# Patient Record
Sex: Female | Born: 1995 | Race: Black or African American | Hispanic: No | Marital: Single | Smoking: Never smoker
Health system: Southern US, Community
[De-identification: ages and names within clinical notes are randomized; demographics above are authoritative.]

## PROBLEM LIST (undated history)

## (undated) DIAGNOSIS — I10 Essential (primary) hypertension: Secondary | ICD-10-CM

## (undated) DIAGNOSIS — F329 Major depressive disorder, single episode, unspecified: Secondary | ICD-10-CM

## (undated) DIAGNOSIS — F32A Depression, unspecified: Secondary | ICD-10-CM

## (undated) DIAGNOSIS — F419 Anxiety disorder, unspecified: Secondary | ICD-10-CM

## (undated) HISTORY — PX: FRACTURE SURGERY: SHX138

---

## 2015-07-20 ENCOUNTER — Emergency Department (HOSPITAL_COMMUNITY)
Admission: EM | Admit: 2015-07-20 | Discharge: 2015-07-20 | Disposition: A | Discharge status: Home / Self Care | Payer: Self-pay

## 2016-05-03 ENCOUNTER — Encounter (HOSPITAL_COMMUNITY): Payer: Self-pay | Admitting: Emergency Medicine

## 2016-05-03 ENCOUNTER — Emergency Department (HOSPITAL_COMMUNITY)
Admission: EM | Admit: 2016-05-03 | Discharge: 2016-05-03 | Disposition: A | Discharge status: Home / Self Care | Payer: Self-pay | Attending: Emergency Medicine | Admitting: Emergency Medicine

## 2016-05-03 DIAGNOSIS — S0990XA Unspecified injury of head, initial encounter: Secondary | ICD-10-CM | POA: Insufficient documentation

## 2016-05-03 DIAGNOSIS — S3991XA Unspecified injury of abdomen, initial encounter: Secondary | ICD-10-CM | POA: Insufficient documentation

## 2016-05-03 DIAGNOSIS — Y9389 Activity, other specified: Secondary | ICD-10-CM | POA: Insufficient documentation

## 2016-05-03 DIAGNOSIS — S3992XA Unspecified injury of lower back, initial encounter: Secondary | ICD-10-CM | POA: Insufficient documentation

## 2016-05-03 DIAGNOSIS — Y92410 Unspecified street and highway as the place of occurrence of the external cause: Secondary | ICD-10-CM | POA: Insufficient documentation

## 2016-05-03 DIAGNOSIS — Y998 Other external cause status: Secondary | ICD-10-CM | POA: Insufficient documentation

## 2016-05-03 NOTE — ED Notes (Signed)
Pt called for triage multiple time no answer RN and PA aware

## 2016-05-03 NOTE — ED Notes (Signed)
Pt did not answer call to room from triage x 3. Left from waiting room without being seen.
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
## 2016-05-03 NOTE — ED Notes (Signed)
Called for room X2. No response

## 2016-05-03 NOTE — ED Notes (Signed)
Called x's 3 without response,.

## 2016-05-03 NOTE — ED Notes (Signed)
Pt called for triage no answer  °

## 2016-05-04 ENCOUNTER — Emergency Department (HOSPITAL_COMMUNITY): Payer: No Typology Code available for payment source

## 2016-05-04 ENCOUNTER — Emergency Department (HOSPITAL_COMMUNITY)
Admission: EM | Admit: 2016-05-04 | Discharge: 2016-05-04 | Disposition: A | Discharge status: Home / Self Care | Payer: No Typology Code available for payment source | Attending: Emergency Medicine | Admitting: Emergency Medicine

## 2016-05-04 ENCOUNTER — Encounter (HOSPITAL_COMMUNITY): Payer: Self-pay

## 2016-05-04 DIAGNOSIS — S3992XA Unspecified injury of lower back, initial encounter: Secondary | ICD-10-CM | POA: Insufficient documentation

## 2016-05-04 DIAGNOSIS — Y92410 Unspecified street and highway as the place of occurrence of the external cause: Secondary | ICD-10-CM | POA: Insufficient documentation

## 2016-05-04 DIAGNOSIS — S0990XA Unspecified injury of head, initial encounter: Secondary | ICD-10-CM | POA: Diagnosis not present

## 2016-05-04 DIAGNOSIS — M542 Cervicalgia: Secondary | ICD-10-CM

## 2016-05-04 DIAGNOSIS — R51 Headache: Secondary | ICD-10-CM

## 2016-05-04 DIAGNOSIS — Y998 Other external cause status: Secondary | ICD-10-CM | POA: Insufficient documentation

## 2016-05-04 DIAGNOSIS — S79922A Unspecified injury of left thigh, initial encounter: Secondary | ICD-10-CM | POA: Diagnosis not present

## 2016-05-04 DIAGNOSIS — Z791 Long term (current) use of non-steroidal anti-inflammatories (NSAID): Secondary | ICD-10-CM | POA: Insufficient documentation

## 2016-05-04 DIAGNOSIS — R519 Headache, unspecified: Secondary | ICD-10-CM

## 2016-05-04 DIAGNOSIS — S0081XA Abrasion of other part of head, initial encounter: Secondary | ICD-10-CM | POA: Diagnosis not present

## 2016-05-04 DIAGNOSIS — M545 Low back pain: Secondary | ICD-10-CM

## 2016-05-04 DIAGNOSIS — S199XXA Unspecified injury of neck, initial encounter: Secondary | ICD-10-CM | POA: Diagnosis not present

## 2016-05-04 DIAGNOSIS — Y9389 Activity, other specified: Secondary | ICD-10-CM | POA: Insufficient documentation

## 2016-05-04 DIAGNOSIS — Z3202 Encounter for pregnancy test, result negative: Secondary | ICD-10-CM | POA: Diagnosis not present

## 2016-05-04 LAB — POC URINE PREG, ED: Preg Test, Ur: NEGATIVE

## 2016-05-04 MED ORDER — MELOXICAM 15 MG PO TABS: 15.0000 mg | ORAL_TABLET | Freq: Every day | ORAL | Status: DC

## 2016-05-04 MED ORDER — OXYCODONE-ACETAMINOPHEN 5-325 MG PO TABS
1.0000 | ORAL_TABLET | Freq: Once | ORAL | Status: AC
  Administered 2016-05-04: 1 via ORAL
  Filled 2016-05-04: qty 1
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
## 2016-05-20 ENCOUNTER — Encounter (HOSPITAL_COMMUNITY): Payer: Self-pay | Admitting: Emergency Medicine

## 2016-05-20 ENCOUNTER — Emergency Department (HOSPITAL_COMMUNITY)
Admission: EM | Admit: 2016-05-20 | Discharge: 2016-05-23 | Disposition: A | Discharge status: Other Acute Inpatient Hospital | Payer: No Typology Code available for payment source | Attending: Emergency Medicine | Admitting: Emergency Medicine

## 2016-05-20 ENCOUNTER — Emergency Department (HOSPITAL_COMMUNITY): Payer: No Typology Code available for payment source

## 2016-05-20 DIAGNOSIS — F1310 Sedative, hypnotic or anxiolytic abuse, uncomplicated: Secondary | ICD-10-CM | POA: Diagnosis present

## 2016-05-20 DIAGNOSIS — F1210 Cannabis abuse, uncomplicated: Secondary | ICD-10-CM | POA: Insufficient documentation

## 2016-05-20 DIAGNOSIS — T50902A Poisoning by unspecified drugs, medicaments and biological substances, intentional self-harm, initial encounter: Secondary | ICD-10-CM

## 2016-05-20 DIAGNOSIS — Z791 Long term (current) use of non-steroidal anti-inflammatories (NSAID): Secondary | ICD-10-CM | POA: Insufficient documentation

## 2016-05-20 DIAGNOSIS — T1491XA Suicide attempt, initial encounter: Secondary | ICD-10-CM

## 2016-05-20 DIAGNOSIS — T39392A Poisoning by other nonsteroidal anti-inflammatory drugs [NSAID], intentional self-harm, initial encounter: Secondary | ICD-10-CM | POA: Insufficient documentation

## 2016-05-20 DIAGNOSIS — F329 Major depressive disorder, single episode, unspecified: Secondary | ICD-10-CM | POA: Insufficient documentation

## 2016-05-20 DIAGNOSIS — I10 Essential (primary) hypertension: Secondary | ICD-10-CM | POA: Insufficient documentation

## 2016-05-20 DIAGNOSIS — T424X2A Poisoning by benzodiazepines, intentional self-harm, initial encounter: Secondary | ICD-10-CM | POA: Insufficient documentation

## 2016-05-20 DIAGNOSIS — F332 Major depressive disorder, recurrent severe without psychotic features: Secondary | ICD-10-CM | POA: Diagnosis present

## 2016-05-20 HISTORY — DX: Essential (primary) hypertension: I10

## 2016-05-20 LAB — COMPREHENSIVE METABOLIC PANEL
ALBUMIN: 4.2 g/dL (ref 3.5–5.0)
ALK PHOS: 44 U/L (ref 38–126)
ALT: 12 U/L — AB (ref 14–54)
AST: 19 U/L (ref 15–41)
Anion gap: 6 (ref 5–15)
BILIRUBIN TOTAL: 0.8 mg/dL (ref 0.3–1.2)
BUN: 6 mg/dL (ref 6–20)
CO2: 21 mmol/L — ABNORMAL LOW (ref 22–32)
CREATININE: 0.69 mg/dL (ref 0.44–1.00)
Calcium: 8.9 mg/dL (ref 8.9–10.3)
Chloride: 110 mmol/L (ref 101–111)
GFR calc Af Amer: >60 mL/min (ref >60)
GLUCOSE: 80 mg/dL (ref 65–99)
POTASSIUM: 4.2 mmol/L (ref 3.5–5.1)
Sodium: 137 mmol/L (ref 135–145)
TOTAL PROTEIN: 6.9 g/dL (ref 6.5–8.1)

## 2016-05-20 LAB — CBC WITH DIFFERENTIAL/PLATELET
BASOS PCT: 0 %
Basophils Absolute: 0 10*3/uL (ref 0.0–0.1)
EOS PCT: 6 %
Eosinophils Absolute: 0.3 10*3/uL (ref 0.0–0.7)
HEMATOCRIT: 37.7 % (ref 36.0–46.0)
Hemoglobin: 12.1 g/dL (ref 12.0–15.0)
Lymphocytes Relative: 31 %
Lymphs Abs: 1.8 10*3/uL (ref 0.7–4.0)
MCH: 25.5 pg — ABNORMAL LOW (ref 26.0–34.0)
MCHC: 32.1 g/dL (ref 30.0–36.0)
MCV: 79.4 fL (ref 78.0–100.0)
MONO ABS: 0.4 10*3/uL (ref 0.1–1.0)
MONOS PCT: 7 %
NEUTROS ABS: 3.2 10*3/uL (ref 1.7–7.7)
Neutrophils Relative %: 56 %
PLATELETS: 197 10*3/uL (ref 150–400)
RBC: 4.75 MIL/uL (ref 3.87–5.11)
RDW: 14.1 % (ref 11.5–15.5)
WBC: 5.8 10*3/uL (ref 4.0–10.5)

## 2016-05-20 LAB — RAPID URINE DRUG SCREEN, HOSP PERFORMED
Amphetamines: NOT DETECTED
BARBITURATES: NOT DETECTED
Benzodiazepines: POSITIVE — AB
Cocaine: NOT DETECTED
Opiates: NOT DETECTED
Tetrahydrocannabinol: POSITIVE — AB

## 2016-05-20 LAB — ETHANOL

## 2016-05-20 LAB — ACETAMINOPHEN LEVEL

## 2016-05-20 LAB — SALICYLATE LEVEL: Salicylate Lvl: <4 mg/dL (ref 2.8–30.0)

## 2016-05-20 MED ORDER — DIPHENHYDRAMINE HCL 50 MG/ML IJ SOLN
INTRAMUSCULAR | Status: AC
  Filled 2016-05-20: qty 1

## 2016-05-20 MED ORDER — LORAZEPAM 2 MG/ML IJ SOLN
1.0000 mg | Freq: Once | INTRAMUSCULAR | Status: AC
  Administered 2016-05-20: 1 mg via INTRAVENOUS
  Filled 2016-05-20: qty 1

## 2016-05-20 MED ORDER — LORAZEPAM 2 MG/ML IJ SOLN
INTRAMUSCULAR | Status: AC
  Filled 2016-05-20: qty 1

## 2016-05-20 MED ORDER — ZIPRASIDONE MESYLATE 20 MG IM SOLR
10.0000 mg | Freq: Once | INTRAMUSCULAR | Status: AC
  Administered 2016-05-20: 10 mg via INTRAMUSCULAR
  Filled 2016-05-20: qty 20

## 2016-05-20 MED ORDER — STERILE WATER FOR INJECTION IJ SOLN
INTRAMUSCULAR | Status: AC
  Filled 2016-05-20: qty 10

## 2016-05-20 MED ORDER — DIPHENHYDRAMINE HCL 50 MG/ML IJ SOLN
50.0000 mg | Freq: Once | INTRAMUSCULAR | Status: AC
  Administered 2016-05-20: 50 mg via INTRAVENOUS

## 2016-05-20 MED ORDER — LORAZEPAM 2 MG/ML IJ SOLN
2.0000 mg | Freq: Once | INTRAMUSCULAR | Status: AC
  Administered 2016-05-20: 2 mg via INTRAVENOUS

## 2016-05-20 NOTE — ED Notes (Signed)
[employer] at bedside 

## 2016-05-20 NOTE — ED Notes (Signed)
Bed: RESA Expected date: 05/20/16 Expected time: 2:32 PM Means of arrival:  Comments: OD
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
## 2016-05-20 NOTE — ED Notes (Signed)
Pt calm at this time, all restraints removed. Sitter at bedside

## 2016-05-20 NOTE — BH Assessment (Signed)
Unable to complete assessment at this time. Pt is drowsy. Will assess when pt is alert.

## 2016-05-20 NOTE — ED Notes (Signed)
Pt calm at this time, will continue to monitor for release criteria

## 2016-05-20 NOTE — ED Notes (Signed)
MD at bedside for assessment
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
## 2016-05-20 NOTE — ED Notes (Signed)
From home contacted girlfriend stating "I'm sorry I OD on something." Pt found in bathroom with meloxicam pill bottle empty filled 05/10/16. Unsure of the amount ingested. Pt reports she also took 2-3 pills of xanax unknown dosage. Now conscious alert denies hx. Per [employer] pt made video on cellular phone expressing that she does not want to be around anymore. "I am so so so sorry the video will explain it all, words can't express my love for you." Vitals stable, pupils equal and reactive.    BP 127/91 HR 106 RESP 28 CBG 89 100% RA
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
## 2016-05-20 NOTE — ED Notes (Signed)
Patient transported to X-ray 
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
## 2016-05-20 NOTE — ED Notes (Signed)
Pt awake, tearful, pt repositioned, R ankle restraint removed, pt educated on conditions for release of other restraints. Pt verbalized understanding.

## 2016-05-20 NOTE — ED Notes (Signed)
Bed: WA16 Expected date:  Expected time:  Means of arrival:  Comments: Hold for resA 
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
## 2016-05-20 NOTE — ED Provider Notes (Addendum)
CSN: [id number]     Arrival date & time 05/20/16  1440 History   First MD Initiated Contact with Patient 05/20/16 1454     Chief Complaint  Patient presents with  . Ingestion     (Consider location/radiation/quality/duration/timing/severity/associated sxs/prior Treatment) Patient is a 20 y.o. female presenting with Ingested Medication. The history is provided by the patient and the police.  Ingestion This is a new problem. Episode onset: 10am. The problem occurs constantly. The problem has not changed since onset.Pertinent negatives include no chest pain, no abdominal pain, no headaches and no shortness of breath. Associated symptoms comments: Took 30 meloxicam and 3 xanax at 10am.  She wanted to kill herself due to fighting with girlfriend and couldn't take it anymore.  She called in text family member saying goodbye. She made a video on her phone has a suicide note. She took the medications can curled up in a sleeping bag in the bathroom and went to sleep. Nothing aggravates the symptoms. Nothing relieves the symptoms. She has tried nothing for the symptoms. The treatment provided no relief.    Past Medical History  Diagnosis Date  . Hypertension    Past Surgical History  Procedure Laterality Date  . Fracture surgery     No family history on file. Social History  Substance Use Topics  . Smoking status: Never Smoker   . Smokeless tobacco: None  . Alcohol Use: Yes     Comment: occ   OB History    No data available     Review of Systems  Respiratory: Negative for shortness of breath.   Cardiovascular: Negative for chest pain.  Gastrointestinal: Negative for abdominal pain.  Neurological: Negative for headaches.  All other systems reviewed and are negative.     Allergies  Review of patient's allergies indicates no known allergies.  Home Medications   Prior to Admission medications   Medication Sig Start Date End Date Taking? Authorizing Provider  ibuprofen  (ADVIL,MOTRIN) 200 MG tablet Take 400 mg by mouth every 6 (six) hours as needed (pain).   Yes Historical Provider, MD  meloxicam (MOBIC) 15 MG tablet Take 1 tablet (15 mg total) by mouth daily. Patient taking differently: Take 15 mg by mouth daily as needed for pain.  05/04/16  Yes Jessica L Focht, PA   BP 132/93 mmHg  Pulse 91  Temp(Src) 97.5 F (36.4 C) (Oral)  Resp 24  SpO2 100%  LMP 05/02/2016 Physical Exam  Constitutional: She is oriented to person, place, and time. She appears well-developed and well-nourished. No distress.  HENT:  Head: Normocephalic and atraumatic.  Mouth/Throat: Oropharynx is clear and moist.  Eyes: Conjunctivae and EOM are normal. Pupils are equal, round, and reactive to light.  Neck: Normal range of motion. Neck supple.  Cardiovascular: Normal rate, regular rhythm and intact distal pulses.   No murmur heard. Pulmonary/Chest: Effort normal and breath sounds normal. No respiratory distress. She has no wheezes. She has no rales.  Abdominal: Soft. She exhibits no distension. There is no tenderness. There is no rebound and no guarding.  Musculoskeletal: Normal range of motion. She exhibits no edema or tenderness.  Neurological: She is alert and oriented to person, place, and time.  Skin: Skin is warm and dry. No rash noted. No erythema.  Psychiatric: Her behavior is normal. She is not actively hallucinating. She expresses suicidal ideation. She expresses suicidal plans.  tearful  Nursing note and vitals reviewed.   ED Course  Procedures (including critical care time) Labs  Review Labs Reviewed  COMPREHENSIVE METABOLIC PANEL - Abnormal; Notable for the following:    CO2 21 (*)    ALT 12 (*)    All other components within normal limits  CBC WITH DIFFERENTIAL/PLATELET - Abnormal; Notable for the following:    MCH 25.5 (*)    All other components within normal limits  URINE RAPID DRUG SCREEN, HOSP PERFORMED - Abnormal; Notable for the following:     Benzodiazepines POSITIVE (*)    Tetrahydrocannabinol POSITIVE (*)    All other components within normal limits  ACETAMINOPHEN LEVEL - Abnormal; Notable for the following:    Acetaminophen (Tylenol), Serum <10 (*)    All other components within normal limits  ETHANOL  SALICYLATE LEVEL  POC URINE PREG, ED    Imaging Review Dg Chest 2 View  05/20/2016  CLINICAL DATA:  Drug overdose EXAM: CHEST  2 VIEW COMPARISON:  None. FINDINGS: The lungs are clear. Heart size and pulmonary vascularity are normal. No pneumothorax or pneumomediastinum. No adenopathy. No bone lesions. IMPRESSION: No edema or consolidation. Electronically Signed   By: Bretta BangWilliam  Woodruff III M.D.   On: 05/20/2016 15:29   I have personally reviewed and evaluated these images and lab results as part of my medical decision-making.   EKG Interpretation   Date/Time:  Friday May 20 2016 14:57:05 EDT Ventricular Rate:  87 PR Interval:  143 QRS Duration: 80 QT Interval:  340 QTC Calculation: 409 R Axis:   69 Text Interpretation:  Sinus rhythm Probable left atrial enlargement No  previous tracing Confirmed by Anitra LauthPLUNKETT  MD, Alphonzo LemmingsWHITNEY ([id number]) on 05/20/2016  4:37:22 PM      MDM   Final diagnoses:  Suicide attempt (HCC)  Intentional drug overdose, initial encounter [facility](HCC)    Patient is a 20 year old female presenting today after ingestion of 30 meloxicam and several Xanax approximately 10 AM at suicide attempt. Patient has been arguing with her girlfriend and finally said that she couldn't take it anymore. She made a suicide notes, called family and then took the medication and curled up in a sleeping bag in the bathtub.  Patient has no prior medical history or prior suicide attempts. She is tearful on exam and guarded. She denies any abdominal pain, nausea, vomiting, headache, chest pain or shortness of breath at this time. EKG without acute findings. CBC, CMP, EtOH, acetaminophen and salicylate levels are all within normal  limits. Chest x-ray and EKG without acute findings. Spoke with poison control and other than stomach ache pt is medically clear.  No further labs needed.   5:37 PM Patient attempted to leave and when stopped she started screaming, cursing, kicking and had to be physically and, clearly restrained. IVC paperwork was done. Gwyneth SproutWhitney Sherral Dirocco, MD 05/20/16 [id number]  Gwyneth SproutWhitney Kinsly Hild, MD 05/20/16 [id number]  Gwyneth SproutWhitney Thaddeus Evitts, MD 05/20/16 [phone]

## 2016-05-21 MED ORDER — LORAZEPAM 1 MG PO TABS
1.0000 mg | ORAL_TABLET | Freq: Four times a day (QID) | ORAL | Status: DC | PRN
  Administered 2016-05-21 (×2): 1 mg via ORAL
  Filled 2016-05-21 (×2): qty 1

## 2016-05-21 NOTE — ED Notes (Signed)
Patient noted in hall. No complaints, stable, in no acute distress. Q15 minute rounds and monitoring via Security Cameras to continue. 

## 2016-05-21 NOTE — ED Notes (Signed)
Patient noted in hall at nurses station. No complaints, stable, in no acute distress. Q15 minute rounds and monitoring via [employer] Cam[employer] to continue.

## 2016-05-21 NOTE — ED Notes (Signed)
[facility]. Patient alert, warm and dry, in no acute distress. Patient declines to talk. Patient made aware of Q15 minute rounds and security cameras for their safety. Patient instructed to come to me with needs or concerns.

## 2016-05-21 NOTE — ED Notes (Signed)
Patient noted sleeping in room. No complaints, stable, in no acute distress. Q15 minute rounds and monitoring via Security Cameras to continue.  

## 2016-05-21 NOTE — BH Assessment (Signed)
[facility]   05/21/16: Patient continues to meet inpatient criteria per IVC as appropriate bed placement is investigated.

## 2016-05-21 NOTE — Progress Notes (Signed)
Disposition CSW completed patient referrals to the following inpatient psych facilities:  [facility]  CSW will continue to follow patient as needed for placement.  Seward SpeckLeo Basilia St[facility],LCAS Behavioral Health Disposition CSW [phone]

## 2016-05-21 NOTE — Progress Notes (Signed)
Pt A & O X4. Denies SI, HI,AVH and pain at this time. Presents with blunt affect and labile mood. Visibly agitated and tearful on approach at intervals. Pt demanding d/c at this time, stated to writer "I did not want to kill myself when I took the [place], now my grandmothers don't want to talk to me" "only my mother is talking to me right now, I just want to leave". Pt educated on IVC status but without understanding related to repeated d/c demands. Verbal outburst X1; told writer "I just need to let out my frustrations". Ativan 1 mg PO administered as prescribed with multiple verbal encouragement. Emotional support and availability provided to pt. Writer encouraged pt to voice concerns / needs. Q 15 minutes checks maintained for safety.
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
## 2016-05-21 NOTE — ED Notes (Signed)
Pt. C/o anxiety and insomnia. 

## 2016-05-21 NOTE — ED Notes (Signed)
Patient noted in room. No complaints, stable, in no acute distress. Q15 minute rounds and monitoring via Security Cameras to continue.  

## 2016-05-22 DIAGNOSIS — F1310 Sedative, hypnotic or anxiolytic abuse, uncomplicated: Secondary | ICD-10-CM | POA: Diagnosis present

## 2016-05-22 DIAGNOSIS — R45851 Suicidal ideations: Secondary | ICD-10-CM

## 2016-05-22 DIAGNOSIS — F332 Major depressive disorder, recurrent severe without psychotic features: Secondary | ICD-10-CM | POA: Diagnosis present

## 2016-05-22 MED ORDER — HYDROXYZINE HCL 25 MG PO TABS: 25.0000 mg | ORAL_TABLET | Freq: Three times a day (TID) | ORAL | Status: DC | PRN

## 2016-05-22 MED ORDER — CITALOPRAM HYDROBROMIDE 10 MG PO TABS
10.0000 mg | ORAL_TABLET | Freq: Every day | ORAL | Status: DC
  Administered 2016-05-22 – 2016-05-23 (×2): 10 mg via ORAL
  Filled 2016-05-22 (×3): qty 1

## 2016-05-22 MED ORDER — HYDROXYZINE HCL 25 MG PO TABS
25.0000 mg | ORAL_TABLET | Freq: Four times a day (QID) | ORAL | Status: DC | PRN
Start: 2016-05-22 — End: 2016-05-23

## 2016-05-22 MED ORDER — TRAZODONE HCL 50 MG PO TABS
50.0000 mg | ORAL_TABLET | Freq: Every day | ORAL | Status: DC
  Administered 2016-05-22: 50 mg via ORAL
  Filled 2016-05-22: qty 1

## 2016-05-22 NOTE — ED Notes (Signed)
Patient noted sleeping in room. No complaints, stable, in no acute distress. Q15 minute rounds and monitoring via Security Cameras to continue.  

## 2016-05-22 NOTE — ED Notes (Signed)
Pt is calm and cooperative today.  She denies S/I, H/I, and AVH.  She refused her meds this am and then changed her mind after talking with her girlfriend.  She has been tearful at times today.  15 minute checks and video monitoring continue.
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
## 2016-05-22 NOTE — BHH Counselor (Addendum)
Pt's girlfriend Sheralyn Boatmanoni [phone]. Writer left voicemail for Sheralyn Boatmanoni in order to obtain collateral info. Pt gave Clinical research associatewriter verbal permission to call girlfriend. Sheralyn Boatmanoni called back. She reports she and pt have been together for two years. She says the last time pt appeared this depressed was very early in their relationship. Sheralyn Boatmanoni reports pt doesn't have a therapist or psychiatrist. She says she has to give pt "pep talks" fairly often. Sheralyn Boatmanoni says, "I told her family that I am not her mom, not her doctor." She says there are no weapons in their house. Writer informed Sheralyn Boatmanoni that TTS is seeking inpatient placement for pt. Girlfriend says pt mentioned going to a facility "for two to four weeks." Writer explained that TTS is seeking short term psychiatric inpatient treatment.  Evette Cristalaroline Paige Courteny Egler, [place] Therapeutic Triage Specialist

## 2016-05-22 NOTE — ED Notes (Signed)
Patient noted in hall. No complaints, stable, in no acute distress. Q15 minute rounds and monitoring via Security Cameras to continue. 

## 2016-05-22 NOTE — ED Notes (Signed)
Patient noted in room. No complaints, stable, in no acute distress. Q15 minute rounds and monitoring via Security Cameras to continue.  

## 2016-05-22 NOTE — ED Notes (Signed)
Report received from Edie Marvin RN. Patient alert and oriented, warm and dry, in no acute distress. Patient denies SI, HI, AVH and pain. Patient made aware of Q15 minute rounds and security cameras for their safety. Patient instructed to come to me with needs or concerns. 

## 2016-05-23 ENCOUNTER — Encounter: Payer: Self-pay | Admitting: Psychiatry

## 2016-05-23 ENCOUNTER — Inpatient Hospital Stay
Admission: AD | Admit: 2016-05-23 | Discharge: 2016-05-26 | DRG: 885 | Disposition: A | Discharge status: Home / Self Care | Payer: No Typology Code available for payment source | Source: Intra-hospital | Attending: Psychiatry | Admitting: Psychiatry

## 2016-05-23 DIAGNOSIS — G4700 Insomnia, unspecified: Secondary | ICD-10-CM | POA: Diagnosis present

## 2016-05-23 DIAGNOSIS — I10 Essential (primary) hypertension: Secondary | ICD-10-CM | POA: Diagnosis present

## 2016-05-23 DIAGNOSIS — T424X2A Poisoning by benzodiazepines, intentional self-harm, initial encounter: Secondary | ICD-10-CM | POA: Diagnosis present

## 2016-05-23 DIAGNOSIS — Z9889 Other specified postprocedural states: Secondary | ICD-10-CM

## 2016-05-23 DIAGNOSIS — R45851 Suicidal ideations: Secondary | ICD-10-CM | POA: Diagnosis present

## 2016-05-23 DIAGNOSIS — F332 Major depressive disorder, recurrent severe without psychotic features: Secondary | ICD-10-CM | POA: Diagnosis not present

## 2016-05-23 DIAGNOSIS — T1491XA Suicide attempt, initial encounter: Secondary | ICD-10-CM | POA: Diagnosis present

## 2016-05-23 DIAGNOSIS — F411 Generalized anxiety disorder: Secondary | ICD-10-CM | POA: Diagnosis present

## 2016-05-23 DIAGNOSIS — F1310 Sedative, hypnotic or anxiolytic abuse, uncomplicated: Secondary | ICD-10-CM | POA: Diagnosis present

## 2016-05-23 DIAGNOSIS — F1220 Cannabis dependence, uncomplicated: Secondary | ICD-10-CM | POA: Diagnosis present

## 2016-05-23 HISTORY — DX: Major depressive disorder, single episode, unspecified: F32.9

## 2016-05-23 HISTORY — DX: Anxiety disorder, unspecified: F41.9

## 2016-05-23 HISTORY — DX: Depression, unspecified: F32.A

## 2016-05-23 MED ORDER — ALUM & MAG HYDROXIDE-SIMETH 200-200-20 MG/5ML PO SUSP: 30.0000 mL | ORAL | Status: DC | PRN

## 2016-05-23 MED ORDER — MAGNESIUM HYDROXIDE 400 MG/5ML PO SUSP: 30.0000 mL | Freq: Every day | ORAL | Status: DC | PRN

## 2016-05-23 MED ORDER — TRAZODONE HCL 100 MG PO TABS
100.0000 mg | ORAL_TABLET | Freq: Every day | ORAL | Status: DC
  Administered 2016-05-23 – 2016-05-25 (×3): 100 mg via ORAL
  Filled 2016-05-23 (×2): qty 1

## 2016-05-23 MED ORDER — TRAZODONE HCL 50 MG PO TABS
ORAL_TABLET | ORAL | Status: AC
  Administered 2016-05-23: 50 mg
  Filled 2016-05-23: qty 2

## 2016-05-23 MED ORDER — ACETAMINOPHEN 325 MG PO TABS: 650.0000 mg | ORAL_TABLET | Freq: Four times a day (QID) | ORAL | Status: DC | PRN
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
## 2016-05-23 NOTE — ED Notes (Signed)
Patient noted sleeping in room. No complaints, stable, in no acute distress. Q15 minute rounds and monitoring via Security Cameras to continue.  

## 2016-05-23 NOTE — ED Notes (Signed)
Sheriff at facility to transfer pt to [facility] per MD order. Pt had no personal belongings. Pt ambulatory off unit with sheriff.

## 2016-05-23 NOTE — BH Assessment (Signed)
[facility].  Accepting physician is Dr. Jennet MaduroPucilowska.  Attending Physician will be Dr. Jennet MaduroPucilowska.  Patient [facility] room [facility] BaxleyPhyll[facility].  Call report to [phone].  Representative/Transfer Coordinator is Nicola Girtalvin  [facility] Staff Catha Nottingham(Jamison, Nurse Practitioner & Jessie Footoyka, TTS) made aware of acceptance.

## 2016-05-23 NOTE — Progress Notes (Addendum)
Provided written information to assist pt with determining choice for uninsured accepting pcps, discussed the importance of pcp vs EDP services for f/u care, www.needymeds.org, www.goodrx.com, discounted pharmacies and other [place] resources such as [facility] , [facility], affordable care act, financial assistance, uninsured dental services, [place] med assist, DSS and  health department  Reviewed resources for [place] uninsured accepting pcps like [facility], family medicine [facility] [address], [facility], [facility], local urgent care centers, [facility], [facility], general medical clinics, [facility] the [place], [facility] plus others, medication resources, [facility] out patient [facility]ies and housing Provided [facility] contact information    Items placed in pt room #37 on her counter

## 2016-05-23 NOTE — ED Notes (Signed)
This nurse called sheriff for transportation, no answer at this time, message left.

## 2016-05-23 NOTE — ED Notes (Signed)
Dinner tray given

## 2016-05-23 NOTE — ED Notes (Signed)
Pt behavior calm and cooperative. Pleasant on approach. Compliant with morning medication regimen. Pt denies SI/HI at this time. Special checks q 15 mins in place for safety. Video monitoring in place.

## 2016-05-24 DIAGNOSIS — F332 Major depressive disorder, recurrent severe without psychotic features: Principal | ICD-10-CM

## 2016-05-24 LAB — LIPID PANEL
CHOL/HDL RATIO: 3.4 ratio
Cholesterol: 136 mg/dL (ref 0–200)
HDL: 40 mg/dL — AB (ref >40)
LDL Cholesterol: 91 mg/dL (ref 0–99)
Triglycerides: 26 mg/dL (ref <150)
VLDL: 5 mg/dL (ref 0–40)

## 2016-05-24 LAB — TSH: TSH: 0.795 u[IU]/mL (ref 0.350–4.500)

## 2016-05-24 LAB — HEMOGLOBIN A1C: HEMOGLOBIN A1C: 5.5 % (ref 4.0–6.0)

## 2016-05-24 LAB — HCG, QUANTITATIVE, PREGNANCY

## 2016-05-24 MED ORDER — MAGNESIUM HYDROXIDE 400 MG/5ML PO SUSP: 30.0000 mL | Freq: Every day | ORAL | Status: DC | PRN

## 2016-05-24 MED ORDER — ALUM & MAG HYDROXIDE-SIMETH 200-200-20 MG/5ML PO SUSP: 30.0000 mL | ORAL | Status: DC | PRN

## 2016-05-24 MED ORDER — TRAZODONE HCL 50 MG PO TABS: 50.0000 mg | ORAL_TABLET | Freq: Every day | ORAL | Status: DC

## 2016-05-24 MED ORDER — HYDROXYZINE HCL 25 MG PO TABS: 25.0000 mg | ORAL_TABLET | Freq: Four times a day (QID) | ORAL | Status: DC | PRN

## 2016-05-24 MED ORDER — CITALOPRAM HYDROBROMIDE 20 MG PO TABS
10.0000 mg | ORAL_TABLET | Freq: Every day | ORAL | Status: DC
  Administered 2016-05-24: 10 mg via ORAL
  Filled 2016-05-24: qty 1

## 2016-05-24 MED ORDER — ACETAMINOPHEN 325 MG PO TABS: 650.0000 mg | ORAL_TABLET | Freq: Four times a day (QID) | ORAL | Status: DC | PRN

## 2016-05-24 MED ORDER — CITALOPRAM HYDROBROMIDE 20 MG PO TABS
20.0000 mg | ORAL_TABLET | Freq: Every day | ORAL | Status: DC
  Administered 2016-05-25 – 2016-05-26 (×2): 20 mg via ORAL
  Filled 2016-05-24 (×2): qty 1

## 2016-05-24 NOTE — [facility] Group Notes (Signed)
[facility]  05/24/2016 10:55 AM  Participation Quality:  Appropriate  Affect:  Appropriate  Cognitive:  Appropriate  Insight:  Developing/Improving  Engagement in Group:  Developing/Improving  Modes of Intervention:  Discussion, Education and Socialization  Summary of Progress/Problems:LCSW introduced self and encouraged group members to introduce themselves and to select a SMART goal one that is Specific, measurable,attainable,relevant and time bound. Discharge process was explained to patients and all questions, were answered. This patient was pleasant and supportive of their peers and has the Smart goal for today is to attend groups, get organized, meet with psychiatrist, social worker and discuss discharge plan.    Lauren SenateBandi, Lauren Ramsey 05/24/2016, 10:55 AM

## 2016-05-24 NOTE — H&P (Signed)
Psychiatric Admission Assessment Adult  Patient Identification: Lauren Ramsey MRN:  [id number] Date of Evaluation:  05/24/2016 Chief Complaint:  Depression  Principal Diagnosis: Major depressive disorder, recurrent severe without psychotic features (HCC) Diagnosis:   Patient Active Problem List   Diagnosis Date Noted  . Cannabis use disorder, moderate, dependence (HCC) [F12.20] 05/23/2016  . Suicide attempt (HCC) [T14.91] 05/23/2016  . Major depressive disorder, recurrent severe without psychotic features (HCC) [F33.2] 05/22/2016  . Benzodiazepine abuse [F13.10] 05/22/2016   History of Present Illness:   Identifying data. Lauren Ramsey is a 20 year old female with no past psychiatric history.  Chief complaint. "I thought it would be better without me."  History of present illness. Information was obtained from the patient's chart. The patient does not really have any significant psychiatric history except for the past year she experienced multiple losses. Her great-grandmother has diet along with her grandfather was the female figure in her life. There are also other family members who are deceased as well as some of her friends. Also her father went to prison and when it happened his family declined that they no longer want to have anything to do with his children. This was a major blow and even though the patient says that she forgets them she has hard time forgetting. She has been increasingly sad herself but also extremely worried about her family. She denies most symptoms of depression except for feeling of guilt worthlessness and hopelessness that led to her suicide attempt that she says was quite impacted. She has 50 muscle relaxant in her hand but decided not to take it and talked to her girlfriend instead she became very emotional and anxious and to couple of Xanax is calm herself down. This is the water down version according to the chart the patient overdosed on muscle relaxants and  benzodiazepines. She denies psychotic symptoms. She denies symptoms of anxiety except for excessive worries and feeling panicky when she receives bad news about someone dying which have been quite a bit lately. She denies substance use but is positive for benzodiazepines and marijuana.  Past psychiatric history. She has never attempted suicide, no psychiatric hospitalizations, no medication trials.  Family psychiatric history. Reportedly there is mental illness on the father's side of the family.  Social history. She graduated from high school and has 2 years of college. She wants to go back to school in the fall to study to be a Scientist, research (physical sciences). She lives with her girlfriend. 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 She doesn't carry a gun.  Total Time spent with patient: 1 hour  Is the patient at risk to self? Yes.    Has the patient been a risk to self in the past 6 months? No.  Has the patient been a risk to self within the distant past? No.  Is the patient a risk to others? No.  Has the patient been a risk to others in the past 6 months? No.  Has the patient been a risk to others within the distant past? No.   Prior Inpatient Therapy:   Prior Outpatient Therapy:    Alcohol Screening: Patient refused Alcohol Screening Tool:  (no) 1. How often do you have a drink containing alcohol?: Never 9. Have you or someone else been injured as a result of your drinking?: No 10. Has a relative or friend or a doctor or another health worker been concerned about your drinking or suggested you cut down?: No Alcohol Use Disorder  Identification Test Final Score (AUDIT): 0 Brief Intervention: Patient declined brief intervention Substance Abuse History in the last 12 months:  Yes.   Consequences of Substance Abuse: Negative Previous Psychotropic Medications: No  Psychological Evaluations: No  Past Medical History:  Past Medical History  Diagnosis Date  . Hypertension   .  Depression   . Anxiety     Past Surgical History  Procedure Laterality Date  . Fracture surgery     Family History: History reviewed. No pertinent family history.  Tobacco Screening: @FLOW [phone]::1)@ Social History:  History  Alcohol Use No    Comment: occ     History  Drug Use  . Yes  . Special: Marijuana    Comment: one month ago    Additional Social History: Marital status: Long term relationship Long term relationship, how long?: 2 years What types of issues is patient dealing with in the relationship?: N/A Are you sexually active?: Yes What is your sexual orientation?: Lesbian  Has your sexual activity been affected by drugs, alcohol, medication, or emotional stress?: N/A Does patient have children?: No    History of alcohol / drug use?: No history of alcohol / drug abuse Negative Consequences of Use: Financial Withdrawal Symptoms: Agitation                    Allergies:  No Known Allergies Lab Results:  Results for orders placed or performed during the hospital encounter of 05/23/16 (from the past 48 hour(s))  Hemoglobin A1c     Status: None   Collection Time: 05/24/16  7:17 AM  Result Value Ref Range   Hgb A1c MFr Bld 5.5 4.0 - 6.0 %  Lipid panel, fasting     Status: Abnormal   Collection Time: 05/24/16  7:17 AM  Result Value Ref Range   Cholesterol 136 0 - 200 mg/dL   Triglycerides 26 <161 mg/dL   HDL 40 (L) >09 mg/dL   Total CHOL/HDL Ratio 3.4 RATIO   VLDL 5 0 - 40 mg/dL   LDL Cholesterol 91 0 - 99 mg/dL    Comment:        Total Cholesterol/HDL:CHD Risk Coronary Heart Disease Risk Table                     Men   Women  1/2 Average Risk   3.4   3.3  Average Risk       5.0   4.4  2 X Average Risk   9.6   7.1  3 X Average Risk  23.4   11.0        Use the calculated Patient Ratio above and the CHD Risk Table to determine the patient's CHD Risk.        ATP III CLASSIFICATION (LDL):  <100     mg/dL   Optimal  604-540  mg/dL   Near or  Above                    Optimal  130-159  mg/dL   Borderline  981-191  mg/dL   High  >478     mg/dL   Very High   TSH     Status: None   Collection Time: 05/24/16  7:17 AM  Result Value Ref Range   TSH 0.795 0.350 - 4.500 uIU/mL  hCG, quantitative, pregnancy     Status: None   Collection Time: 05/24/16  7:17 AM  Result Value Ref Range   hCG, Beta  Chain, Quant, S <1 <5 mIU/mL    Comment:          GEST. AGE      CONC.  (mIU/mL)   <=1 WEEK        5 - 50     2 WEEKS       50 - 500     3 WEEKS       100 - 10,000     4 WEEKS     1,000 - 30,000     5 WEEKS     3,500 - 115,000   6-8 WEEKS     12,000 - 270,000    12 WEEKS     15,000 - 220,000        FEMALE AND NON-PREGNANT FEMALE:     LESS THAN 5 mIU/mL     Blood Alcohol level:  Lab Results  Component Value Date   ETH <5 05/20/2016    Metabolic Disorder Labs:  Lab Results  Component Value Date   HGBA1C 5.5 05/24/2016   No results found for: PROLACTIN Lab Results  Component Value Date   CHOL 136 05/24/2016   TRIG 26 05/24/2016   HDL 40* 05/24/2016   CHOLHDL 3.4 05/24/2016   VLDL 5 05/24/2016   LDLCALC 91 05/24/2016    Current Medications: Current Facility-Administered Medications  Medication Dose Route Frequency Provider Last Rate Last Dose  . acetaminophen (TYLENOL) tablet 650 mg  650 mg Oral Q6H PRN Alantra Popoca B Emerlyn Mehlhoff, MD      . alum & mag hydroxide-simeth (MAALOX/MYLANTA) 200-200-20 MG/5ML suspension 30 mL  30 mL Oral Q4H PRN Shari Prows, MD      . Melene Muller ON 05/25/2016] citalopram (CELEXA) tablet 20 mg  20 mg Oral Daily Saima Monterroso B Viaan Knippenberg, MD      . hydrOXYzine (ATARAX/VISTARIL) tablet 25 mg  25 mg Oral Q6H PRN Charm Rings, NP      . magnesium hydroxide (MILK OF MAGNESIA) suspension 30 mL  30 mL Oral Daily PRN Kepler Mccabe B Taylorann Tkach, MD      . traZODone (DESYREL) tablet 100 mg  100 mg Oral QHS Gianlucca Szymborski B Lavergne Hiltunen, MD   100 mg at 05/23/16 2146   PTA Medications: Prescriptions prior to admission   Medication Sig Dispense Refill Last Dose  . ibuprofen (ADVIL,MOTRIN) 200 MG tablet Take 400 mg by mouth every 6 (six) hours as needed (pain).   unknown    Musculoskeletal: Strength & Muscle Tone: within normal limits Gait & Station: normal Patient leans: N/A  Psychiatric Specialty Exam: Physical Exam  Nursing note and vitals reviewed. Constitutional: She is oriented to person, place, and time. She appears well-developed and well-nourished.  HENT:  Head: Normocephalic.  Eyes: Conjunctivae and EOM are normal. Pupils are equal, round, and reactive to light.  Neck: Normal range of motion. Neck supple.  Cardiovascular: Regular rhythm and normal heart sounds.   Respiratory: Effort normal and breath sounds normal.  GI: Soft. Bowel sounds are normal.  Musculoskeletal: Normal range of motion.  Neurological: She is alert and oriented to person, place, and time.  Skin: Skin is warm and dry.    Review of Systems  Psychiatric/Behavioral: Positive for depression and suicidal ideas. The patient is nervous/anxious.   All other systems reviewed and are negative.   Blood pressure 115/79, pulse 90, temperature 98.8 F (37.1 C), temperature source Oral, resp. rate 18, height  (1.676 m), weight 53.071 kg (117 lb), last menstrual period 05/02/2016, SpO2 100 %.Body mass index  is 18.89 kg/(m^2).  See SRA.                                                  Sleep:  Number of Hours: 5    Treatment Plan Summary: Daily contact with patient to assess and evaluate symptoms and progress in treatment and Medication management   Ms. Dacy is a 20 year old female with no past psychiatric history admitted after a suicide attempt by overdose in the context of severe social stressors.  1. Suicidal ideation. The patient is able to contract for safety in the hospital.  2. Mood. She was started on Lexapro.  3. Anxiety. Vistaril is available.  4. Insomnia. Trazodone is  available.  5. Cannabis abuse. The patient minimizes her problems and declines treatment.  6. Disposition. She will be discharged to home with her girlfriend. She will follow up with [facility].    Observation Level/Precautions:  15 minute checks  Laboratory:  CBC Chemistry Profile UDS UA  Psychotherapy:    Medications:    Consultations:    Discharge Concerns:    Estimated LOS:  Other:     I certify that inpatient services furnished can reasonably be expected to improve the patient's condition.    Kristine LineaJolanta Stassi Fadely, MD 6/20/20175:19 PM

## 2016-05-24 NOTE — Plan of Care (Signed)
Problem: Education: Goal: Knowledge of [facility] Education information/materials will improve Outcome: Progressing Instructed admission process and tour of unit and schedule of activities, patient needs to progress in timie [employer] RN

## 2016-05-24 NOTE — BHH Suicide Risk Assessment (Signed)
B[facility] INPATIENT:  Family/Significant Other Suicide Prevention Education  Suicide Prevention Education:  Education Completed; Patient's partner, Terrial Rhodesoni Clemons (ph#: [phone]) has been identified by the patient as the family member/significant other with whom the patient will be residing, and identified as the person(s) who will aid the patient in the event of a mental health crisis (suicidal ideations/suicide attempt).  With written consent from the patient, the family member/significant other has been provided the following suicide prevention education, prior to the and/or following the discharge of the patient. CSW reviewed SPE with patient, patient expressed understanding. Pt's partner, Sheralyn Boatmanoni, also is agreeable to following protocol for suicide prevention.   The suicide prevention education provided includes the following:  Suicide risk factors  Suicide prevention and interventions  National Suicide Hotline telephone number  [facility] assessment telephone number  [facility] Emergency Assistance 911  [facility] and/or Residential Mobile Crisis Unit telephone number  Request made of family/significant other to:  Remove weapons (e.g., guns, rifles, knives), all items previously/currently identified as safety concern.    Remove drugs/medications (over-the-counter, prescriptions, illicit drugs), all items previously/currently identified as a safety concern.  The family member/significant other verbalizes understanding of the suicide prevention education information provided.  The family member/significant other agrees to remove the items of safety concern listed above.  Lynden OxfordKadijah R Atonya Templer, LCSW-A 05/24/2016, 3:44 PM

## 2016-05-24 NOTE — Progress Notes (Signed)
D: Patient is a 20 y.o.  year-old female admitted to [facility] ambulatory without difficulty. Patient is alert and oriented upon admission. A: Admission assessment completed without difficulty. Skin and contraband assessment completed with Baird Lyonsasey RN with no skin abnormalities nor contraband found. Q.15 minute safety checks were implemented at the time of admission. Patient was oriented to the unit and escorted to room   304                                                    R: Patient was receptive to and cooperative with admission assessment. Patient contracts for safety on the unit at this time

## 2016-05-24 NOTE — BHH Suicide Risk Assessment (Signed)
[facility]   Nursing information obtained from:  Patient Demographic factors:  Gay, lesbian, or bisexual orientation Current Mental Status:  Suicidal ideation indicated by patient Loss Factors:  Financial problems / change in socioeconomic status Historical Factors:  Impulsivity Risk Reduction Factors:  Positive social support  Total Time spent with patient: 1 hour Principal Problem: Major depressive disorder, recurrent severe without psychotic features (HCC) Diagnosis:   Patient Active Problem List   Diagnosis Date Noted  . Depression, major, severe recurrence (HCC) [F33.2] 05/24/2016  . Cannabis use disorder, moderate, dependence (HCC) [F12.20] 05/23/2016  . Suicide attempt (HCC) [T14.91] 05/23/2016  . Major depressive disorder, recurrent severe without psychotic features (HCC) [F33.2] 05/22/2016  . Benzodiazepine abuse [F13.10] 05/22/2016   Subjective Data: Depression, suicidal.  Continued Clinical Symptoms:  Alcohol Use Disorder Identification Test Final Score (AUDIT): 0 The "Alcohol Use Disorders Identification Test", Guidelines for Use in Primary Care, Second Edition.  World Science writerHealth Organization [facility](WHO). Score between 0-7:  no or low risk or alcohol related problems. Score between 8-15:  moderate risk of alcohol related problems. Score between 16-19:  high risk of alcohol related problems. Score 20 or above:  warrants further diagnostic evaluation for alcohol dependence and treatment.   CLINICAL FACTORS:   Depression:   Impulsivity   Musculoskeletal: Strength & Muscle Tone: within normal limits Gait & Station: normal Patient leans: N/A  Psychiatric Specialty Exam: Physical Exam  Nursing note and vitals reviewed.   Review of Systems  Psychiatric/Behavioral: Positive for depression and suicidal ideas.  All other systems reviewed and are negative.   Blood pressure 115/79, pulse 90, temperature 98.8 F (37.1 C), temperature source Oral,  resp. rate 18, height 5\' 6"  (1.676 m), weight 53.071 kg (117 lb), last menstrual period 05/02/2016, SpO2 100 %.Body mass index is 18.89 kg/(m^2).  General Appearance: Casual  Eye Contact:  Good  Speech:  Clear and Coherent  Volume:  Normal  Mood:  Anxious  Affect:  Appropriate  Thought Process:  Goal Directed  Orientation:  Full (Time, Place, and Person)  Thought Content:  WDL  Suicidal Thoughts:  Yes.  with intent/plan  Homicidal Thoughts:  No  Memory:  Immediate;   Fair Recent;   Fair Remote;   Fair  Judgement:  Impaired  Insight:  Fair  Psychomotor Activity:  Normal  Concentration:  Concentration: Fair and Attention Span: Fair  Recall:  [employer]  Fund of Knowledge:  Fair  Language:  Fair  Akathisia:  No  Handed:  Right  AIMS (if indicated):     Assets:  Communication Skills Desire for Improvement Housing Physical Health Resilience Social Support  ADL's:  Intact  Cognition:  WNL  Sleep:  Number of Hours: 5      COGNITIVE FEATURES THAT CONTRIBUTE TO RISK:  None    SUICIDE RISK:   Moderate:  Frequent suicidal ideation with limited intensity, and duration, some specificity in terms of plans, no associated intent, good self-control, limited dysphoria/symptomatology, some risk factors present, and identifiable protective factors, including available and accessible social support.  PLAN OF CARE: Hospital admission, medication management, discharge planning.  Ms. Zorita PangLattimore is a 20 year old female with no past psychiatric history admitted after a suicide attempt by overdose in the context of severe social stressors.  1. Suicidal ideation. The patient is able to contract for safety in the hospital.  2. Mood. She was started on Lexapro.  3. Anxiety. Vistaril is available.  4. Insomnia. Trazodone is available.  5. Cannabis abuse. The  patient minimizes her problems and declines treatment.  6. Disposition. She will be discharged to home with her girlfriend. She will follow  up with [facility].  I certify that inpatient services furnished can reasonably be expected to improve the patient's condition.   Kristine Linea, MD 05/24/2016, 5:14 PM

## 2016-05-24 NOTE — [facility] Group Notes (Signed)
[facility]  05/24/2016 11:19 AM  Participation Quality:  Appropriate  Affect:  Appropriate  Cognitive:  Appropriate  Insight:  Developing/Improving  Engagement in Group:  Developing/Improving  Modes of Intervention:  Confrontation, Discussion, Education and Support  Summary of Progress/Problems:LCSW introduced self and encouraged group members to introduce themselves reviewed group rules. Today's group was on  " My diagnosis" and patients encouraged to understand their symptoms, risk factors, strengths and what to do when patients were feeling depressed. Lauren Ramsey was able to  share her dark thoughts and as a collected group her peers were able to relate and support and suggest good ideas to assist patients when they are feeling sad,depressed or suicidal. Ideas shared encompassed talking to doctor, staying on medications, get a therapist, attend groups increase exercise and reduce stress and anxiety. This patient was very kind, compassionate to her peers and she was able to reflect openly to peers. Lauren Ramsey, Lauren Ramsey M 05/24/2016, 11:19 AM

## 2016-05-24 NOTE — Tx Team (Signed)
Initial Interdisciplinary Treatment Plan   PATIENT STRESSORS: Father and his family worklife   PATIENT STRENGTHS: Positive support system Positive about treatment   PROBLEM LIST: Problem List/Patient Goals Date to be addressed Date deferred Reason deferred Estimated date of resolution  depression 05/24/16   06/02/16  SI 05/24/16   06/02/16  caannibus usage  05/24/16   06/02/16  "DECREASE ANXIETY" 05/24/16   06/02/16  'medication for depressioon"  05/24/16   06/02/16                           DISCHARGE CRITERIA:  WHEN ANXIETY IS CONTROLLED DEPRESSION IS MANAGED  PRELIMINARY DISCHARGE PLAN: Participate in family therapy Return to previous living arrangement  PATIENT/FAMIILY INVOLVEMENT: This treatment plan has been presented to and reviewed with the patient, Lauren Ramsey, and/or family member,   The patient and family have been given the opportunity to ask questions and make suggestions.  Hillery JacksChristine W Aviv Lengacher 05/24/2016, 3:58 AM

## 2016-05-24 NOTE — B[facility] Group Notes (Signed)
[facility] Group Notes:  (Nursing/MHT/Case Management/Adjunct)  Date:  05/24/2016  Time:  5:51 PM  Type of Therapy:  Psychoeducational Skills  Participation Level:  Active  Participation Quality:  Appropriate, Attentive and Sharing  Affect:  Appropriate  Cognitive:  Alert and Appropriate  Insight:  Appropriate and Good  Engagement in Group:  Engaged  Modes of Intervention:  Discussion, Education and Support  Summary of Progress/Problems:  Lynelle SmokeCara Travis Alessandra Sawdey 05/24/2016, 5:51 PM

## 2016-05-24 NOTE — B[facility] Group Notes (Signed)
[facility] Group Notes:  (Nursing/MHT/Case Management/Adjunct)  Date:  05/24/2016  Time:  9:52 PM  Type of Therapy:  Group Therapy  Participation Level:  Active  Participation Quality:  Appropriate  Affect:  Appropriate  Cognitive:  Appropriate  Insight:  Appropriate  Engagement in Group:  Engaged  Modes of Intervention:  Discussion  Summary of Progress/Problems:  Lauren Ramsey 05/24/2016, 9:52 PM

## 2016-05-24 NOTE — Progress Notes (Signed)
Patient with sad, blunted affect, cooperative behavior with meals, meds and plan of care. Quiet and appropriate when staff initiates interaction. No SI/HI at this time. Orient to unit and therapy groups. Patient tired rt recent admit. Therapy groups encouraged. Rest encouraged during free time. Safety maintained.

## 2016-05-24 NOTE — Progress Notes (Signed)
Recreation Therapy Notes  INPATIENT RECREATION THERAPY ASSESSMENT  Patient Details Name: Lauren Ramsey MRN: [id number] DOB: 05/16/1996 Today's Date: 05/24/2016  Patient Stressors: Family, Death Dad is in jail and his side of the family has disowned her - they do not think she is actually his daughter; grandfather who was a father figure died in January, great grandmother died in March, and friend who was like a brother died around [facility].  Coping Skills:   Arguments, Exercise, Art/Dance, Talking, Music, Sports, Other (Comment) (Forgive and forget; think positive)  Personal Challenges: Communication, Relationships, Stress Management  Leisure Interests (2+):  Art - Paint, Sports - Basketball  Awareness of Community Resources:  Yes  Community Resources:  Other (Comment) (Basketball court, lakes, swimming pool, tennis court, sports center)  Current Use: Yes  If no, Barriers?:    Patient Strengths:  Skin color, hair  Patient Identified Areas of Improvement:  Not really  Current Recreation Participation:  Painting, music, using facilities at neighborhood, playing with puppy  Patient Goal for Hospitalization:  Coping will anxiety  [place]  [place]idence[place]   Current [place] (including self-harm):  No  Current HI:  No  Consent to Intern Participation: N/A   Lauren Ramsey,Akito Boomhower M, LRT/CTRS 05/24/2016, 2:47 PM
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
## 2016-05-24 NOTE — Progress Notes (Signed)
Recreation Therapy Notes  Date: 6.20.17 Time: 1:00 pm Location: Craft Room  Group Topic: Self-expression  Goal Area(s) Addresses:  Patient will be able to identify a color that represents each emotion. Patient will verbalize benefit of using art as a means of self-expression. Patient will verbalize one emotions experienced while participating in group.  Behavioral Response: Attentive, Interactive  Intervention: The Colors Within Me  Activity: Patients were given a blank face worksheet and instructed to pick a color for each emotion they were experiencing and to show on the face how much of that emotion they were feeling.  Education: LRT educated patients on other forms of self-expression.   Education Outcome: Acknowledges education/In group clarification offered  Clinical Observations/Feedback: Patient completed activity by picking a color for each emotion and showing how much of that emotion she was feeling. Patient contributed to group discussion by stating what emotions she was feeling, that her emotions are dynamic, how she sees her emotions changing once she is d/c, and what steps she is taking to change her emotions.  Jacquelynn CreeGreene,Kenichi Cassada M, LRT/CTRS 05/24/2016 2:31 PM

## 2016-05-24 NOTE — Plan of Care (Signed)
Problem: Coping: Goal: Ability to identify and develop effective coping behavior will improve Outcome: Not Progressing Patient has not learned coping skills as of yet this shift Tax adviserCTownsend RN

## 2016-05-24 NOTE — Plan of Care (Signed)
Problem: Education: Goal: Ability to state activities that reduce stress will improve Outcome: Progressing Patient able to state activities that reduces stress CTownsend RN

## 2016-05-25 LAB — PROLACTIN: PROLACTIN: 82.6 ng/mL — AB (ref 4.8–23.3)

## 2016-05-25 MED ORDER — HYDROXYZINE HCL 25 MG PO TABS: 25.0000 mg | ORAL_TABLET | Freq: Four times a day (QID) | ORAL | Status: AC | PRN

## 2016-05-25 MED ORDER — HYDROXYZINE HCL 25 MG PO TABS: 25.0000 mg | ORAL_TABLET | Freq: Four times a day (QID) | ORAL | Status: DC | PRN

## 2016-05-25 MED ORDER — TRAZODONE HCL 100 MG PO TABS: 100.0000 mg | ORAL_TABLET | Freq: Every day | ORAL | Status: AC

## 2016-05-25 MED ORDER — TRAZODONE HCL 100 MG PO TABS: 100.0000 mg | ORAL_TABLET | Freq: Every day | ORAL | Status: DC

## 2016-05-25 MED ORDER — CITALOPRAM HYDROBROMIDE 20 MG PO TABS
20.0000 mg | ORAL_TABLET | Freq: Every day | ORAL | Status: AC
Start: 2016-05-25 — End: ?

## 2016-05-25 MED ORDER — CITALOPRAM HYDROBROMIDE 20 MG PO TABS: 20.0000 mg | ORAL_TABLET | Freq: Every day | ORAL | Status: DC

## 2016-05-25 NOTE — [facility] Suicide Risk Assessment (Signed)
[facility]   Principal Problem: Major depressive disorder, recurrent severe without psychotic features [facility](HCC) Discharge Diagnoses:  Patient Active Problem List   Diagnosis Date Noted  . Cannabis use disorder, moderate, dependence (HCC) [F12.20] 05/23/2016  . Suicide attempt (HCC) [T14.91] 05/23/2016  . Major depressive disorder, recurrent severe without psychotic features (HCC) [F33.2] 05/22/2016  . Benzodiazepine abuse [F13.10] 05/22/2016    Total Time spent with patient: 30 minutes  Musculoskeletal: Strength & Muscle Tone: within normal limits Gait & Station: normal Patient leans: N/A  Psychiatric Specialty Exam: Review of Systems  Psychiatric/Behavioral: Positive for depression. The patient is nervous/anxious.   All other systems reviewed and are negative.   Blood pressure 116/79, pulse 64, temperature 98.9 F (37.2 C), temperature source Oral, resp. rate 18, height 5\' 6"  (1.676 m), weight 53.071 kg (117 lb), last menstrual period 05/02/2016, SpO2 100 %.Body mass index is 18.89 kg/(m^2).  General Appearance: Casual  Eye Contact::  Good  Speech:  Clear and Coherent409  Volume:  Normal  Mood:  Euthymic  Affect:  Appropriate  Thought Process:  Goal Directed  Orientation:  Full (Time, Place, and Person)  Thought Content:  WDL  Suicidal Thoughts:  No  Homicidal Thoughts:  No  Memory:  Immediate;   Fair Recent;   Fair Remote;   Fair  Judgement:  Impaired  Insight:  Fair  Psychomotor Activity:  Normal  Concentration:  Fair  Recall:  [employer]  Fund of Knowledge:Fair  Language: Fair  Akathisia:  No  Handed:  Right  AIMS (if indicated):     Assets:  Communication Skills Desire for Improvement Housing Intimacy Physical Health Resilience Social Support  Sleep:  Number of Hours: 7.45  Cognition: WNL  ADL's:  Intact   Mental Status Per Nursing Assessment::   On Admission:  Suicidal ideation indicated by patient  Demographic Factors:  Gay,  lesbian, or bisexual orientation  Loss Factors: Loss of significant relationship  Historical Factors: Family history of mental illness or substance abuse and Impulsivity  Risk Reduction Factors:   Sense of responsibility to family, Employed, Living with another person, especially a relative and Positive social support  Continued Clinical Symptoms:  Depression:   Comorbid alcohol abuse/dependence Impulsivity Alcohol/Substance Abuse/Dependencies  Cognitive Features That Contribute To Risk:  None    Suicide Risk:  Minimal: No identifiable suicidal ideation.  Patients presenting with no risk factors but with morbid ruminations; may be classified as minimal risk based on the severity of the depressive symptoms  Follow-up Information    [facility] with [facility] on 05/27/2016.   Specialty:  Behavioral Health   Why:  Please arrive to the walk-in clinic between the hours of 8am-5pm for an assessment and medication and therapy. Arrive as early as possible for prompt service.   Contact information:   [address] [place] [id number] [phone]       Plan Of Care/Follow-up recommendations:  Activity:  as tolerated. Diet:  regular. Other:  keep follow up appointments.  Kristine LineaJolanta Jacorion Klem, MD 05/25/2016, 10:15 PM

## 2016-05-25 NOTE — Plan of Care (Signed)
Problem: Coping: Goal: Ability to identify and develop effective coping behavior will improve Outcome: Progressing Calm and cooperative. Med compliant. S/e and adverse reactions discussed. No PRNs given. Attend groups.  Denies SI/HI. q 15 min checks maintained for safety.

## 2016-05-25 NOTE — Plan of Care (Signed)
Problem: [facility] Participation in Recreation Therapeutic Interventions Goal: STG-Patient will identify at least five coping skills for ** STG: Coping Skills - Within 4 treatment sessions, patient will verbalize at least 5 new coping skills in each of 2 treatment sessions to increase healthy coping skills.  Outcome: Progressing Treatment Session 1; Completed 1 out of 2: At approximately 2:20 pm, LRT met with patient in craft room. Patient verbalized 20 coping skills. LRT encouraged patient to continue thinking of healthy coping skills.  Leonette [facility], LRT/CTRS 06.21.17 4:24 pm Goal: STG-Other Recreation Therapy Goal (Specify) STG: Stress Management - Within 4 treatment sessions, patient will verbalize understanding of the stress management techniques in each of 2 treatment sessions to increase stress management skills post d/c.  Outcome: Progressing Treatment Session 1; Completed 1 out of 2: At approximately 2:20 pm, LRT met with patient in craft room. LRT educated and provided patient with handouts on stress management techniques. Patient verbalized understanding. LRT encouraged patient to read over and practice the stress management techniques.  Leonette [facility], LRT/CTRS 06.21.17 4:26 pm

## 2016-05-25 NOTE — Progress Notes (Signed)
Calm and cooperative. Bright affect. Attends group. Interacting with peers and staff. Med compliant. Denies SI/HI. No c/o pain/discomfort noted. No c/o pain/discomfort noted.

## 2016-05-25 NOTE — Tx Team (Signed)
Interdisciplinary Treatment Plan Update (Adult)         Date: 05/25/2016   Time Reviewed: 10:30 AM   Progress in Treatment: Yes Attending groups: Yes  Participating in groups: Yes  Taking medication as prescribed: Yes  Tolerating medication: Yes  Family/Significant other contact made: Yes, CSW has spoken with partner, Dwaine Deter  Patient understands diagnosis: Yes  Discussing patient identified problems/goals with staff: Yes  Medical problems stabilized or resolved: Yes  Denies suicidal/homicidal ideation: Yes  Issues/concerns per patient self-inventory: Yes  Other:   New problem(s) identified: N/A   Discharge Plan or Barriers: see below   Reason for Continuation of Hospitalization:   Depression   Anxiety   Medication Stabilization   Comments: N/A   Estimated length of stay: 3-5 days    Patient is a 20 year old  female admitted for SI and depression. Patient lives in [place], [place]. Patient will benefit from crisis stabilization, medication evaluation, group therapy, and psycho education in addition to case management for discharge planning. Patient and CSW reviewed pt's identified goals and treatment plan. Pt verbalized understanding and agreed to treatment plan.    Review of initial/current patient goals per problem list:  1. Goal(s): Patient will participate in aftercare plan   Met: Yes  Target date: 3-5 days post admission date   As evidenced by: Patient will participate within aftercare plan AEB aftercare provider and housing plan at discharge being identified.   05/25/16: Patient will follow-up with [facility] on Friday for medication management and therapy.  2. Goal (s): Patient will exhibit decreased depressive symptoms and suicidal ideations.   Met: Yes  Target date: 3-5 days post admission date   As evidenced by: Patient will utilize self-rating of depression at 3 or below and demonstrate decreased signs of depression or be deemed stable for  discharge by MD.   05/25/16: Patient denies SI/HI. Patient reports a depression score of 2 today. Adequate for discharge per MD.   3. Goal(s): Patient will demonstrate decreased signs and symptoms of anxiety.   Met: Yes  Target date: 3-5 days post admission date   As evidenced by: Patient will utilize self-rating of anxiety at 3 or below and demonstrated decreased signs of anxiety, or be deemed stable for discharge by MD   05/25/16: Patient reports an anxiety score of 2. Adequate for discharge per MD.    Attendees:  Patient:  Family:  Physician: Dr. Bary Leriche , MD     05/25/2016 10:30 AM  Nursing: , RNt       05/25/2016 10:30 AM  Clinical Social Worker: Glorious Peach, [place]  05/25/2016 10:30 AM  Other: Everitt Amber, Recreational Therapist   05/25/2016 10:30 AM

## 2016-05-25 NOTE — Progress Notes (Signed)
Recreation Therapy Notes  Date: 06.21.17 Time: 1:00 pm Location: Craft Room  Group Topic: Self-esteem  Goal Area(s) Addresses:  Patient will be able to identify benefit of self-esteem. Patient will be able to identify ways to increase self-esteem.  Behavioral Response: Attentive, Interactive  Intervention: Self-Portrait  Activity: Patients were given a blank face worksheet and instructed to draw their self-portrait of how they were feeling. Patients were given paper and told to put their name and something positive about themselves. Patient passed their papers around the group and wrote positive traits about peers. Patient drew their self-portrait after their read the positive traits.  Education: LRT educated patients on ways they can increase their self-esteem.  Education Outcome: Acknowledges education/In group clarification offered  Clinical Observations/Feedback: Patient completed activity by drawing both self-portraits and writing positive trait about himself and his peers. Patient contributed to group discussion by stating how her faces were different, how she felt after reading the positive traits from peers, that she did not find it hard to believe the positive traits and why, and how she can improve her self-esteem.  Jacquelynn CreeGreene,Athan Casalino M, LRT/CTRS 05/25/2016 4:16 PM

## 2016-05-25 NOTE — Progress Notes (Signed)
D: Patient stated slept good last night .Stated appetite is good and energy level  Is normal. Stated concentration is good . Stated on Depression scale , 0 hopeless 0 and anxiety  0 .( low 0-10 high) Denies suicidal  homicidal ideations  .  No auditory hallucinations  No pain concerns . Appropriate ADL'S. Interacting with peers and staff.  A: Encourage patient participation with unit programming . Instruction  Given on  Medication , verbalize understanding. Patient moved  Out of room due to malfunction   R: Voice no other concerns. Staff continue to monitor

## 2016-05-25 NOTE — BHH Group Notes (Signed)
BHH Group Notes:  (Nursing/MHT/Case Management/Adjunct)  Date:  05/25/2016  Time:  9:42 PM  Type of Therapy:  Group Therapy  Participation Level:  Active  Participation Quality:  Appropriate  Affect:  Appropriate  Cognitive:  Appropriate  Insight:  Appropriate  Engagement in Group:  Engaged  Modes of Intervention:  Discussion  Summary of Progress/Problems:  Burt EkJanice Marie Eddie Payette 05/25/2016, 9:42 PM

## 2016-05-25 NOTE — Discharge Summary (Signed)
Physician Discharge Summary Note  Patient:  Lauren Ramsey is an 20 y.o., female MRN:  [id number] DOB:  01/14/1996 Patient phone:  [phone] (home)  Patient address:   [address] [place] [address],  Total Time spent with patient: 30 minutes  Date of Admission:  05/23/2016 Date of Discharge: 05/26/2016  Reason for Admission:  Suicide attempt.  Identifying data. Lauren Ramsey is a 20 year old female with no past psychiatric history.  Chief complaint. "I thought it would be better without me."  History of present illness. Information was obtained from the patient's chart. The patient does not really have any significant psychiatric history except for the past year she experienced multiple losses. Her great-grandmother has diet along with her grandfather was the female figure in her life. There are also other family members who are deceased as well as some of her friends. Also her father went to prison and when it happened his family declined that they no longer want to have anything to do with his children. This was a major blow and even though the patient says that she forgets them she has hard time forgetting. She has been increasingly sad herself but also extremely worried about her family. She denies most symptoms of depression except for feeling of guilt worthlessness and hopelessness that led to her suicide attempt that she says was quite impacted. She has 50 muscle relaxant in her hand but decided not to take it and talked to her girlfriend instead she became very emotional and anxious and to couple of Xanax is calm herself down. This is the water down version according to the chart the patient overdosed on muscle relaxants and benzodiazepines. She denies psychotic symptoms. She denies symptoms of anxiety except for excessive worries and feeling panicky when she receives bad news about someone dying which have been quite a bit lately. She denies substance use but is positive for  benzodiazepines and marijuana.  Past psychiatric history. She has never attempted suicide, no psychiatric hospitalizations, no medication trials.  Family psychiatric history. Reportedly there is mental illness on the father's side of the family.  Social history. She graduated from high school and has 2 years of college. She wants to go back to school in the fall to study to be a Scientist, research (physical sciences). She lives with her girlfriend. 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 She doesn't carry a gun.  Principal Problem: Major depressive disorder, recurrent severe without psychotic features [facility]) Discharge Diagnoses: Patient Active Problem List   Diagnosis Date Noted  . Cannabis use disorder, moderate, dependence (HCC) [F12.20] 05/23/2016  . Suicide attempt (HCC) [T14.91] 05/23/2016  . Major depressive disorder, recurrent severe without psychotic features (HCC) [F33.2] 05/22/2016  . Benzodiazepine abuse [F13.10] 05/22/2016   Past Medical History:  Past Medical History  Diagnosis Date  . Hypertension   . Depression   . Anxiety     Past Surgical History  Procedure Laterality Date  . Fracture surgery     Family History: History reviewed. No pertinent family history.   Social History:  History  Alcohol Use No    Comment: occ     History  Drug Use  . Yes  . Special: Marijuana    Comment: one month ago    Social History   Social History  . Marital Status: Single    Spouse Name: N/A  . Number of Children: N/A  . Years of Education: N/A   Social History Main Topics  .  Smoking status: Never Smoker   . Smokeless tobacco: None  . Alcohol Use: No     Comment: occ  . Drug Use: Yes    Special: Marijuana     Comment: one month ago  . Sexual Activity: Yes    Birth Control/ Protection: None     Comment: HOMOSEXUAL   Other Topics Concern  . None   Social History Narrative    Hospital Course:    Lauren Ramsey is a 20 year old female with no past psychiatric  history admitted after a suicide attempt by overdose in the context of severe social stressors.  1. Suicidal ideation. This resolved. The patient is able to contract for safety. She is forward thinking and optimistic about the future.   2. Mood. She was started on Celexa.   3. Anxiety. Vistaril is available.  4. Insomnia. Trazodone is available.  5. Cannabis abuse. The patient minimizes her problems and declines treatment.  6. Disposition. She was discharged to home with her girlfriend. She will follow up with [facility].  Physical Findings: AIMS: Facial and Oral Movements Muscles of Facial Expression: None, normal Lips and Perioral Area: None, normal Jaw: None, normal Tongue: None, normal,Extremity Movements Upper (arms, wrists, hands, fingers): None, normal Lower (legs, knees, ankles, toes): None, normal, Trunk Movements Neck, shoulders, hips: None, normal, Overall Severity Severity of abnormal movements (highest score from questions above): None, normal Incapacitation due to abnormal movements: None, normal Patient's awareness of abnormal movements (rate only patient's report): No Awareness, Dental Status Current problems with teeth and/or dentures?: No Does patient usually wear dentures?: No  CIWA:    COWS:     Musculoskeletal: Strength & Muscle Tone: within normal limits Gait & Station: normal Patient leans: N/A  Psychiatric Specialty Exam: Physical Exam  Nursing note and vitals reviewed.   Review of Systems  Psychiatric/Behavioral: Positive for depression.  All other systems reviewed and are negative.   Blood pressure 116/79, pulse 64, temperature 98.9 F (37.2 C), temperature source Oral, resp. rate 18, height 5\' 6"  (1.676 m), weight 53.071 kg (117 lb), last menstrual period 05/02/2016, SpO2 100 %.Body mass index is 18.89 kg/(m^2).  See SRA.                                                  Sleep:  Number of Hours: 7.45     Have you used  any form of tobacco in the last 30 days? (Cigarettes, Smokeless Tobacco, Cigars, and/or Pipes): No  Has this patient used any form of tobacco in the last 30 days? (Cigarettes, Smokeless Tobacco, Cigars, and/or Pipes) Yes, Yes, A prescription for an FDA-approved tobacco cessation medication was offered at discharge and the patient refused  Blood Alcohol level:  Lab Results  Component Value Date   [facility]    Metabolic Disorder Labs:  Lab Results  Component Value Date   HGBA1C 5.5 05/24/2016   Lab Results  Component Value Date   PROLACTIN 82.6* 05/24/2016   Lab Results  Component Value Date   CHOL 136 05/24/2016   TRIG 26 05/24/2016   HDL 40* 05/24/2016   CHOLHDL 3.4 05/24/2016   VLDL 5 05/24/2016   LDLCALC 91 05/24/2016    See Psychiatric Specialty Exam and Suicide Risk Assessment completed by Attending Physician prior to discharge.  Discharge destination:  Home  Is patient on multiple  antipsychotic therapies at discharge:  No   Has Patient had three or more failed trials of antipsychotic monotherapy by history:  No  Recommended Plan for Multiple Antipsychotic Therapies: NA  Discharge Instructions    Diet - low sodium heart healthy    Complete by:  As directed      Increase activity slowly    Complete by:  As directed             Medication List    TAKE these medications      Indication   citalopram 20 MG tablet  Commonly known as:  CELEXA  Take 1 tablet (20 mg total) by mouth daily.   Indication:  Depression     hydrOXYzine 25 MG tablet  Commonly known as:  ATARAX/VISTARIL  Take 1 tablet (25 mg total) by mouth every 6 (six) hours as needed for anxiety.   Indication:  Anxiety Neurosis     ibuprofen 200 MG tablet  Commonly known as:  ADVIL,MOTRIN  Take 400 mg by mouth every 6 (six) hours as needed (pain).      traZODone 100 MG tablet  Commonly known as:  DESYREL  Take 1 tablet (100 mg total) by mouth at bedtime.   Indication:  Trouble  Sleeping           Follow-up Information    Follow up with [facility]. Go on 05/27/2016.   Specialty:  Behavioral Health   Why:  Please arrive to the walk-in clinic between the hours of 8am-5pm for an assessment and medication and therapy. Arrive as early as possible for prompt service.   [address] [place] [address] [phone]       Follow-up recommendations:  Activity:  as tolerated. Diet:  regular. Other:  keep follow up appointments.  Comments:    Signed: Kristine Linea, MD 05/25/2016, 10:21 PM

## 2016-05-25 NOTE — Progress Notes (Signed)
[facility] MD Progress Note  05/25/2016 6:01 AM Maryuri Warnke  MRN:  [id number]  Subjective:  Lauren Ramsey is depressed and has passing thoughts of suicide. She has to make difficult decisions including family and relationship issues, going back to school, maintaining her employment. She feels rather anxious about her future. She accepts medications and tolerates them well. There are no somatic complaints. There is excellent group participation.  Principal Problem: Major depressive disorder, recurrent severe without psychotic features (HCC) Diagnosis:   Patient Active Problem List   Diagnosis Date Noted  . Cannabis use disorder, moderate, dependence (HCC) [F12.20] 05/23/2016  . Suicide attempt (HCC) [T14.91] 05/23/2016  . Major depressive disorder, recurrent severe without psychotic features (HCC) [F33.2] 05/22/2016  . Benzodiazepine abuse [F13.10] 05/22/2016   Total Time spent with patient: 20 minutes  Past Psychiatric History: Depression, mood instability, substance abuse.  Past Medical History:  Past Medical History  Diagnosis Date  . Hypertension   . Depression   . Anxiety     Past Surgical History  Procedure Laterality Date  . Fracture surgery     Family History: History reviewed. No pertinent family history. Family Psychiatric  History: See H&P. Social History:  History  Alcohol Use No    Comment: occ     History  Drug Use  . Yes  . Special: Marijuana    Comment: one month ago    Social History   Social History  . Marital Status: Single    Spouse Name: N/A  . Number of Children: N/A  . Years of Education: N/A   Social History Main Topics  . Smoking status: Never Smoker   . Smokeless tobacco: None  . Alcohol Use: No     Comment: occ  . Drug Use: Yes    Special: Marijuana     Comment: one month ago  . Sexual Activity: Yes    Birth Control/ Protection: None     Comment: HOMOSEXUAL   Other Topics Concern  . None   Social History Narrative    Additional Social History:    History of alcohol / drug use?: No history of alcohol / drug abuse Negative Consequences of Use: Financial Withdrawal Symptoms: Agitation                    Sleep: Fair  Appetite:  Fair  Current Medications: Current Facility-Administered Medications  Medication Dose Route Frequency Provider Last Rate Last Dose  . acetaminophen (TYLENOL) tablet 650 mg  650 mg Oral Q6H PRN Vernard Gram B Samoria Fedorko, MD      . alum & mag hydroxide-simeth (MAALOX/MYLANTA) 200-200-20 MG/5ML suspension 30 mL  30 mL Oral Q4H PRN Dwan Fennel B Kameela Leipold, MD      . citalopram (CELEXA) tablet 20 mg  20 mg Oral Daily Amaura Authier B Elaf Clauson, MD      . hydrOXYzine (ATARAX/VISTARIL) tablet 25 mg  25 mg Oral Q6H PRN Charm Rings, NP      . magnesium hydroxide (MILK OF MAGNESIA) suspension 30 mL  30 mL Oral Daily PRN Nella Botsford B Teasha Murrillo, MD      . traZODone (DESYREL) tablet 100 mg  100 mg Oral QHS Shari Prows, MD   100 mg at 05/24/16 2118    Lab Results:  Results for orders placed or performed during the hospital encounter of 05/23/16 (from the past 48 hour(s))  Hemoglobin A1c     Status: None   Collection Time: 05/24/16  7:17 AM  Result Value Ref Range  Hgb A1c MFr Bld 5.5 4.0 - 6.0 %  Lipid panel, fasting     Status: Abnormal   Collection Time: 05/24/16  7:17 AM  Result Value Ref Range   Cholesterol 136 0 - 200 mg/dL   Triglycerides 26 <161 mg/dL   HDL 40 (L) >09 mg/dL   Total CHOL/HDL Ratio 3.4 RATIO   VLDL 5 0 - 40 mg/dL   LDL Cholesterol 91 0 - 99 mg/dL    Comment:        Total Cholesterol/HDL:CHD Risk Coronary Heart Disease Risk Table                     Men   Women  1/2 Average Risk   3.4   3.3  Average Risk       5.0   4.4  2 X Average Risk   9.6   7.1  3 X Average Risk  23.4   11.0        Use the calculated Patient Ratio above and the CHD Risk Table to determine the patient's CHD Risk.        ATP III CLASSIFICATION (LDL):  <100     mg/dL    Optimal  604-540  mg/dL   Near or Above                    Optimal  130-159  mg/dL   Borderline  981-191  mg/dL   High  >478     mg/dL   Very High   Prolactin     Status: Abnormal   Collection Time: 05/24/16  7:17 AM  Result Value Ref Range   Prolactin 82.6 (H) 4.8 - 23.3 ng/mL    Comment: (NOTE) Performed At: [facility] [address] [place], [place] [id number] Mila Homer MD MV:[id number]   TSH     Status: None   Collection Time: 05/24/16  7:17 AM  Result Value Ref Range   TSH 0.795 0.350 - 4.500 uIU/mL  hCG, quantitative, pregnancy     Status: None   Collection Time: 05/24/16  7:17 AM  Result Value Ref Range   hCG, Beta Chain, Quant, S <1 <5 mIU/mL    Comment:          GEST. AGE      CONC.  (mIU/mL)   <=1 WEEK        5 - 50     2 WEEKS       50 - 500     3 WEEKS       100 - 10,000     4 WEEKS     1,000 - 30,000     5 WEEKS     3,500 - 115,000   6-8 WEEKS     12,000 - 270,000    12 WEEKS     15,000 - 220,000        FEMALE AND NON-PREGNANT FEMALE:     LESS THAN 5 mIU/mL     Blood Alcohol level:  Lab Results  Component Value Date   ETH <5 05/20/2016    Physical Findings: AIMS: Facial and Oral Movements Muscles of Facial Expression: None, normal Lips and Perioral Area: None, normal Jaw: None, normal Tongue: None, normal,Extremity Movements Upper (arms, wrists, hands, fingers): None, normal Lower (legs, knees, ankles, toes): None, normal, Trunk Movements Neck, shoulders, hips: None, normal, Overall Severity Severity of abnormal movements (highest score from questions above): None, normal Incapacitation due to abnormal  movements: None, normal Patient's awareness of abnormal movements (rate only patient's report): No Awareness, Dental Status Current problems with teeth and/or dentures?: No Does patient usually wear dentures?: No  CIWA:    COWS:     Musculoskeletal: Strength & Muscle Tone: within normal limits Gait & Station: normal Patient  leans: N/A  Psychiatric Specialty Exam: Physical Exam  Nursing note and vitals reviewed.   Review of Systems  Psychiatric/Behavioral: Positive for depression, suicidal ideas and substance abuse.  All other systems reviewed and are negative.   Blood pressure 115/79, pulse 90, temperature 98.8 F (37.1 C), temperature source Oral, resp. rate 18, height 5\' 6"  (1.676 m), weight 53.071 kg (117 lb), last menstrual period 05/02/2016, SpO2 100 %.Body mass index is 18.89 kg/(m^2).  General Appearance: Casual  Eye Contact:  Good  Speech:  Clear and Coherent  Volume:  Normal  Mood:  Anxious and Depressed  Affect:  Blunt  Thought Process:  Goal Directed  Orientation:  Full (Time, Place, and Person)  Thought Content:  WDL  Suicidal Thoughts:  Yes.  with intent/plan  Homicidal Thoughts:  No  Memory:  Immediate;   Fair Recent;   Fair Remote;   Fair  Judgement:  Impaired  Insight:  Present  Psychomotor Activity:  Normal  Concentration:  Concentration: Fair and Attention Span: Fair  Recall:  FiservFair  Fund of Knowledge:  Fair  Language:  Fair  Akathisia:  No  Handed:  Right  AIMS (if indicated):     Assets:  Communication Skills Desire for Improvement Physical Health Resilience Social Support Transportation Vocational/Educational  ADL's:  Intact  Cognition:  WNL  Sleep:  Number of Hours: 7.45     Treatment Plan Summary: Daily contact with patient to assess and evaluate symptoms and progress in treatment and Medication management   Lauren Ramsey is a 20 year old female with no past psychiatric history admitted after a suicide attempt by overdose in the context of severe social stressors.  1. Suicidal ideation. The patient is able to contract for safety in the hospital.  2. Mood. She was started on Celexa.   3. Anxiety. Vistaril is available.  4. Insomnia. Trazodone is available.  5. Cannabis abuse. The patient minimizes her problems and declines treatment.  6. Disposition.  She will be discharged to home with her girlfriend. She will follow up with [facility].  Kristine LineaJolanta Ilan Kahrs, MD 05/25/2016, 6:01 AM

## 2016-05-25 NOTE — [facility] Group Notes (Signed)
[facility] LCSW Group Therapy  05/25/2016 10:00 AM  Type of Therapy:  Group Therapy  Participation Level:  Active  Participation Quality:  Attentive  Affect:  Appropriate  Cognitive:  Alert  Insight:  Improving  Engagement in Therapy:  Improving  Modes of Intervention:  Discussion, Education, Socialization and Support  Summary of Progress/Problems:Emotional Regulation: Patients will identify both negative and positive emotions. They will discuss emotions they have difficulty regulating and how they impact their lives. Patients will be asked to identify healthy coping skills to combat unhealthy reactions to negative emotions.   Lauren Ramsey attended group and stayed the entire time. She discussed separating herself from negative relationships. When she feels overwhelmed, she enjoys being outside.   Lauren Ramsey L Duanna Runk MSW, LCSWA  05/25/2016, 10:00 AM

## 2016-05-25 NOTE — Plan of Care (Signed)
Problem: Coping: Goal: Ability to cope will improve Outcome: Progressing Patient verbalizes positive coping skills on the unit this shift  Tax adviserCTownsend RN

## 2016-05-25 NOTE — [facility] Group Notes (Signed)
[facility] Group Notes:  (Nursing/MHT/Case Management/Adjunct)  Date:  05/25/2016  Time:  6:45 PM  Type of Therapy:  Psychoeducational Skills  Participation Level:  Active  Participation Quality:  Appropriate, Sharing and Supportive  Affect:  Appropriate  Cognitive:  Appropriate  Insight:  Appropriate  Engagement in Group:  Supportive  Modes of Intervention:  Discussion and Education  Summary of Progress/Problems: patient actively engaged in group conversation   Lauren Ramsey 05/25/2016, 6:45 PM

## 2016-05-25 NOTE — Progress Notes (Signed)

## 2016-05-26 NOTE — Plan of Care (Signed)
Problem: Coping: Goal: Ability to identify and develop effective coping behavior will improve Outcome: Progressing Ability to look at situations diferently

## 2016-05-26 NOTE — Progress Notes (Signed)
D:Patient aware of discharge this shift . Patient returning home . Patient received all belonging locked up . Patient denies  Suicidal  And homicidal ideations  .  A: Writer instructed on discharge criteria  . Informed of 7 day supply  Of medication   Prescriptions,  AVS form,  Suicidal Risk Assesment,  And Discharge Summary given to patient . Aware  Of follow up appointment . R: Patient left unit with no questions  Or concerns  With group home staff.

## 2016-05-26 NOTE — Progress Notes (Signed)
Recreation Therapy Notes  INPATIENT RECREATION TR PLAN  Patient Details Name: Lauren Ramsey MRN: [id number] DOB: [birth year] Today's Date: 05/26/2016  Rec Therapy Plan Is patient appropriate for Therapeutic Recreation?: Yes Treatment times per week: At least once a week TR Treatment/Interventions: 1:1 session, Group participation (Comment) (Appropriate participation in daily recreational therapy tx)  Discharge Criteria Pt will be discharged from therapy if:: Treatment goals are met, Discharged Treatment plan/goals/alternatives discussed and agreed upon by:: Patient/family  Discharge Summary Short term goals set: See Care Plan Short term goals met: Complete Progress toward goals comments: One-to-one attended Which groups?: Self-esteem, Other (Comment) (Self-expression) One-to-one attended: Stress Management, Coping Skills Reason goals not met: N/A Therapeutic equipment acquired: None Reason patient discharged from therapy: Discharge from hospital Pt/family agrees with progress & goals achieved: Yes Date patient discharged from therapy: 05/26/16   [facility], LRT/CTRS 05/26/2016, 3:51 PM

## 2016-05-26 NOTE — Progress Notes (Signed)
  [facility] :  Will you be returning to the same living situation after discharge:  Yes,  home  At discharge, do you have transportation home?: Yes,  girlfriend pick up  Do you have the ability to pay for your medications: Yes,  mental health   Release of information consent forms completed and in the chart;  Patient's signature needed at discharge.  Patient to Follow up at: Follow-up Information    [facility].   Specialty:  Behavioral Health   Why:  Please arrive to the walk-in clinic between the hours of 8am-5pm for an assessment and medication and therapy. Arrive as early as possible for prompt service.   Contact information:   [address] [place] [address] [phone]       Next level of care provider [facility] Link:no  Safety Planning and Suicide Prevention discussed: Yes,  with patient and partner   Have you used any form of tobacco in the last 30 days? (Cigarettes, Smokeless Tobacco, Cigars, and/or Pipes): No  Has patient been referred to the Quitline?: N/A patient is not a smoker  Patient has been referred for addiction treatment: N/A  Rondall Allegraandace L Sherman Donaldson MSW, LCSWA  05/26/2016, 9:41 AM

## 2016-05-26 NOTE — Plan of Care (Signed)
Problem: [facility] Participation in Recreation Therapeutic Interventions Goal: STG-Patient will identify at least five coping skills for ** STG: Coping Skills - Within 4 treatment sessions, patient will verbalize at least 5 new coping skills in each of 2 treatment sessions to increase healthy coping skills.  Outcome: Completed/Met Date Met:  05/26/16 Treatment Session 2; Completed 2 out of 2: At approximately 10:35 am, LRT met with patient in community room. Patient verbalized 5 new coping skills. LRT encouraged patient to continue thinking of healthy coping skills.   M , LRT/CTRS 06.22.17 3:49 pm Goal: STG-Other Recreation Therapy Goal (Specify) STG: Stress Management - Within 4 treatment sessions, patient will verbalize understanding of the stress management techniques in each of 2 treatment sessions to increase stress management skills post d/c.  Outcome: Completed/Met Date Met:  05/26/16 Treatment Session 2; Completed 2 out of 2: At approximately 10:35 am, LRT met with patient in community room. Patient reported she read over and practiced the stress management techniques. Patient verbalized understanding and reported the techniques were helpful. LRT encouraged patient to continue practicing the stress management techniques.   M , LRT/CTRS 06.22.17 3:50 pm     

## 2017-08-28 IMAGING — CR DG CHEST 2V
2 series · 2 of 2 positions shown · non-contrast
Comparison: None.

CLINICAL DATA: Drug overdose

EXAM:
CHEST  2 VIEW

[w chest lat]
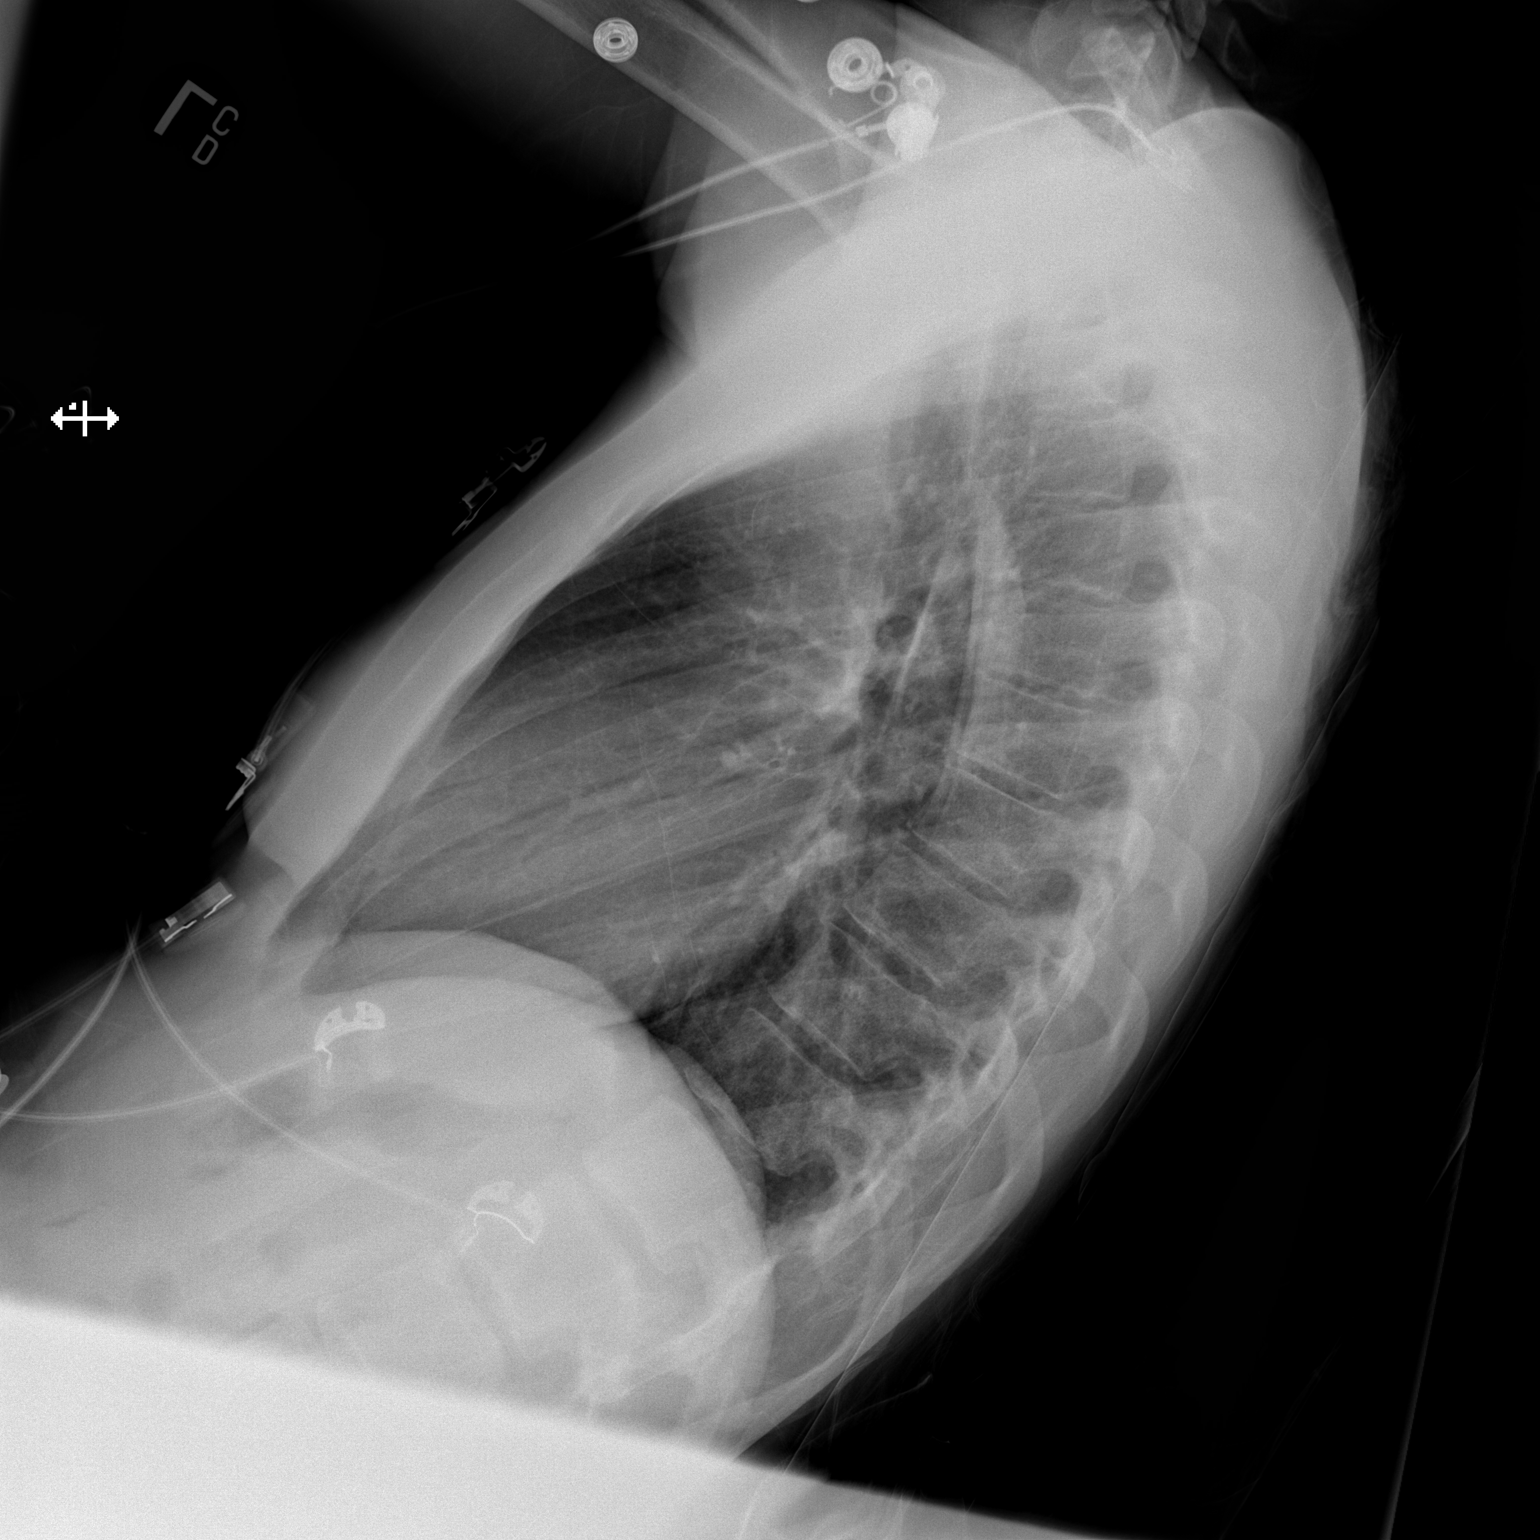

[x chest ap]
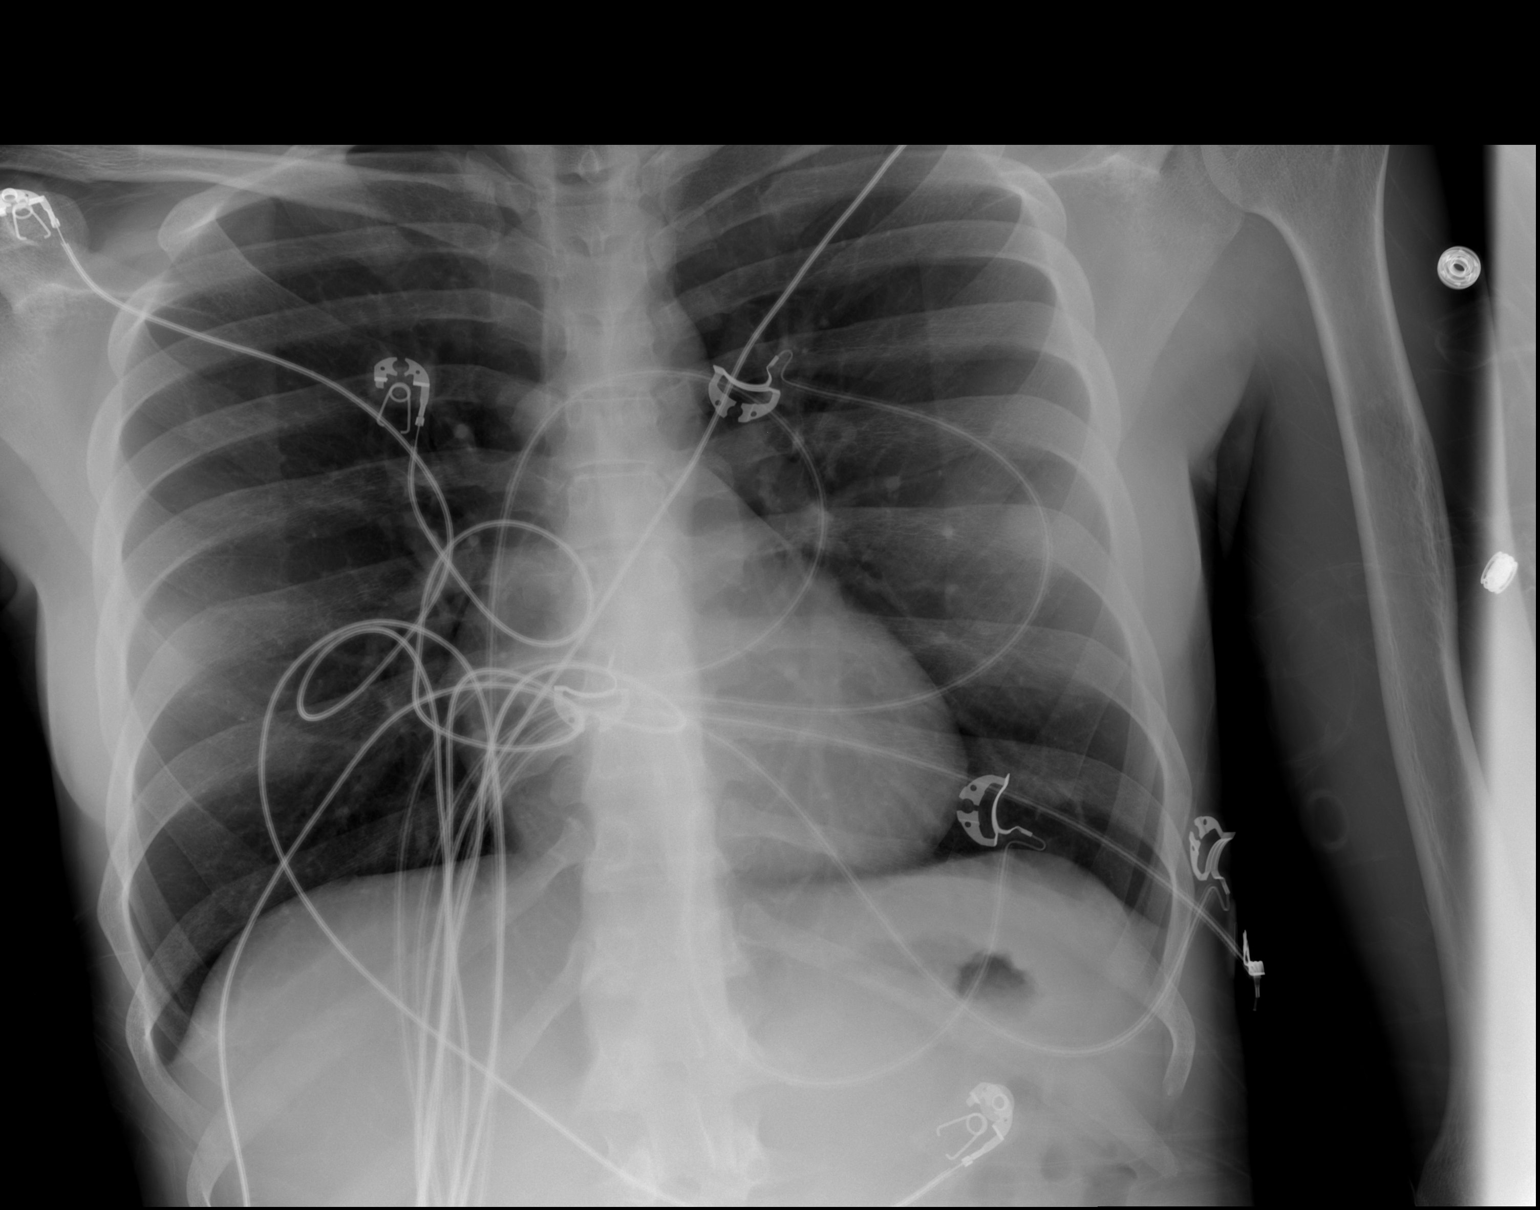

[2 of 2 positions shown; findings below may reference images not displayed]

FINDINGS: The lungs are clear. Heart size and pulmonary vascularity are
normal. No pneumothorax or pneumomediastinum. No adenopathy. No bone
lesions.
IMPRESSION: No edema or consolidation.

## 2018-04-12 ENCOUNTER — Encounter (HOSPITAL_COMMUNITY): Payer: Self-pay | Admitting: *Deleted

## 2018-04-12 ENCOUNTER — Emergency Department (HOSPITAL_COMMUNITY)
Admission: EM | Admit: 2018-04-12 | Discharge: 2018-04-12 | Disposition: A | Discharge status: Home / Self Care | Payer: Self-pay | Attending: Emergency Medicine | Admitting: Emergency Medicine

## 2018-04-12 DIAGNOSIS — R45851 Suicidal ideations: Secondary | ICD-10-CM | POA: Insufficient documentation

## 2018-04-12 DIAGNOSIS — F329 Major depressive disorder, single episode, unspecified: Secondary | ICD-10-CM | POA: Insufficient documentation

## 2018-04-12 DIAGNOSIS — Z5321 Procedure and treatment not carried out due to patient leaving prior to being seen by health care provider: Secondary | ICD-10-CM | POA: Insufficient documentation

## 2018-04-12 LAB — CBC
HEMATOCRIT: 35.9 % — AB (ref 36.0–46.0)
Hemoglobin: 11.6 g/dL — ABNORMAL LOW (ref 12.0–15.0)
MCH: 26.4 pg (ref 26.0–34.0)
MCHC: 32.3 g/dL (ref 30.0–36.0)
MCV: 81.6 fL (ref 78.0–100.0)
PLATELETS: 233 10*3/uL (ref 150–400)
RBC: 4.4 MIL/uL (ref 3.87–5.11)
RDW: 15.9 % — ABNORMAL HIGH (ref 11.5–15.5)
WBC: 6.3 10*3/uL (ref 4.0–10.5)

## 2018-04-12 LAB — COMPREHENSIVE METABOLIC PANEL
ALBUMIN: 4.4 g/dL (ref 3.5–5.0)
ALT: 15 U/L (ref 14–54)
AST: 25 U/L (ref 15–41)
Alkaline Phosphatase: 53 U/L (ref 38–126)
Anion gap: 9 (ref 5–15)
BUN: 12 mg/dL (ref 6–20)
CHLORIDE: 107 mmol/L (ref 101–111)
CO2: 22 mmol/L (ref 22–32)
Calcium: 9.2 mg/dL (ref 8.9–10.3)
Creatinine, Ser: 0.62 mg/dL (ref 0.44–1.00)
GFR calc Af Amer: >60 mL/min (ref >60)
GFR calc non Af Amer: >60 mL/min (ref >60)
GLUCOSE: 111 mg/dL — AB (ref 65–99)
POTASSIUM: 3.7 mmol/L (ref 3.5–5.1)
Sodium: 138 mmol/L (ref 135–145)
Total Bilirubin: 0.5 mg/dL (ref 0.3–1.2)
Total Protein: 7.7 g/dL (ref 6.5–8.1)

## 2018-04-12 LAB — ACETAMINOPHEN LEVEL

## 2018-04-12 LAB — SALICYLATE LEVEL: Salicylate Lvl: <7 mg/dL (ref 2.8–30.0)

## 2018-04-12 LAB — ETHANOL

## 2018-04-12 MED ORDER — ACETAMINOPHEN 325 MG PO TABS: 650.0000 mg | ORAL_TABLET | ORAL | Status: DC | PRN

## 2018-04-12 MED ORDER — ONDANSETRON HCL 4 MG PO TABS: 4.0000 mg | ORAL_TABLET | Freq: Three times a day (TID) | ORAL | Status: DC | PRN

## 2018-04-12 MED ORDER — ALUM & MAG HYDROXIDE-SIMETH 200-200-20 MG/5ML PO SUSP: 30.0000 mL | Freq: Four times a day (QID) | ORAL | Status: DC | PRN

## 2018-04-12 NOTE — ED Triage Notes (Signed)
Pt states she has been having suicidal thoughts and would like to go back on medication. Pt states she ran out of her anxiety and depression medication. Pt denies HI/AV hallucinations.

## 2018-04-12 NOTE — ED Notes (Signed)
Dr Penne Lash verbalized that patient was chronic SI. When asked patient to undress by Leta Jungling RN , patient decided to elope. [name] asked patient was she going to wait on prescriptions, no response, continued to walk out door.

## 2018-04-12 NOTE — ED Notes (Signed)
Bed: [facility]2 Expected date:  Expected time:  Means of arrival:  Comments: 

## 2018-04-12 NOTE — ED Notes (Signed)
Bed: [facility] Expected date:  Expected time:  Means of arrival:  Comments: 

## 2018-04-12 NOTE — ED Notes (Signed)
Bed: [facility] Expected date:  Expected time:  Means of arrival:  Comments: 

## 2018-04-12 NOTE — ED Notes (Signed)
Gave patient scrubs to change in to

## 2018-04-12 NOTE — ED Provider Notes (Signed)
[facility]-EMERGENCY DEPT Provider Note   CSN: [id number] Arrival date & time: 04/12/18  1637     History   Chief Complaint Chief Complaint  Patient presents with  . Suicidal    HPI Lauren Ramsey is a 22 y.o. female.  HPI 22 year old female with history of anxiety and depression here with suicidality.  The patient was hospitalized in 2017 after suicide attempt.  She states that she took some antidepressants after the hospitalization but then could no longer afford it.  She is been off them for at least the last year.  She endorses progressively worsening depression.  She has poor appetite and poor mood.  She has difficulty sleeping.  Over the last month, she broke up with her significant other and reports increased stress.  She is now having daily thoughts of suicide.  She states that earlier today, she even went so far as to wrap Christmas lights around her neck.  She has no persistent plan, but intermittently does think about overdosing or hurting herself in other ways.  She subsequently presents for further evaluation.  She is here voluntarily.  Denies any drug use.  Past Medical History:  Diagnosis Date  . Anxiety   . Depression   . Hypertension     Patient Active Problem List   Diagnosis Date Noted  . Cannabis use disorder, moderate, dependence (HCC) 05/23/2016  . Suicide attempt (HCC) 05/23/2016  . Major depressive disorder, recurrent severe without psychotic features (HCC) 05/22/2016  . Benzodiazepine abuse (HCC) 05/22/2016    Past Surgical History:  Procedure Laterality Date  . FRACTURE SURGERY       OB History   None      Home Medications    Prior to Admission medications   Medication Sig Start Date End Date Taking? Authorizing Provider  citalopram (CELEXA) 20 MG tablet Take 1 tablet (20 mg total) by mouth daily. 05/25/16   Pucilowska, Braulio Conte B, MD  hydrOXYzine (ATARAX/VISTARIL) 25 MG tablet Take 1 tablet (25 mg total) by mouth  every 6 (six) hours as needed for anxiety. 05/25/16   Pucilowska, Jolanta B, MD  ibuprofen (ADVIL,MOTRIN) 200 MG tablet Take 400 mg by mouth every 6 (six) hours as needed (pain).    [provider]  traZODone (DESYREL) 100 MG tablet Take 1 tablet (100 mg total) by mouth at bedtime. 05/25/16   Shari Prows, MD    Family History No family history on file.  Social History Social History   Tobacco Use  . Smoking status: Never Smoker  Substance Use Topics  . Alcohol use: No    Comment: occ  . Drug use: Yes    Types: Marijuana    Comment: one month ago     Allergies   Patient has no known allergies.   Review of Systems Review of Systems  Psychiatric/Behavioral: Positive for dysphoric mood and suicidal ideas. The patient is nervous/anxious.   All other systems reviewed and are negative.    Physical Exam Updated Vital Signs BP (!) 153/96 (BP Location: Right Arm)   Pulse (!) 109   Temp 98.9 F (37.2 C) (Oral)   Resp 18   LMP 04/12/2018   SpO2 100%   Physical Exam  Constitutional: She is oriented to person, place, and time. She appears well-developed and well-nourished. No distress.  HENT:  Head: Normocephalic and atraumatic.  Eyes: Conjunctivae are normal.  Neck: Neck supple.  Cardiovascular: Normal rate, regular rhythm and normal heart sounds. Exam reveals no friction  rub.  No murmur heard. Pulmonary/Chest: Effort normal and breath sounds normal. No respiratory distress. She has no wheezes. She has no rales.  Abdominal: She exhibits no distension.  Musculoskeletal: She exhibits no edema.  Neurological: She is alert and oriented to person, place, and time. She exhibits normal muscle tone.  Skin: Skin is warm. Capillary refill takes less than 2 seconds.  Psychiatric: She has a normal mood and affect. Her speech is normal and behavior is normal. She expresses suicidal ideation. She expresses suicidal plans.  Nursing note and vitals reviewed.    ED  Treatments / Results  Labs (all labs ordered are listed, but only abnormal results are displayed) Labs Reviewed  COMPREHENSIVE METABOLIC PANEL - Abnormal; Notable for the following components:      Result Value   Glucose, Bld 111 (*)    All other components within normal limits  ACETAMINOPHEN LEVEL - Abnormal; Notable for the following components:   Acetaminophen (Tylenol), Serum <10 (*)    All other components within normal limits  CBC - Abnormal; Notable for the following components:   Hemoglobin 11.6 (*)    HCT 35.9 (*)    RDW 15.9 (*)    All other components within normal limits  ETHANOL  SALICYLATE LEVEL  RAPID URINE DRUG SCREEN, HOSP PERFORMED  PREGNANCY, URINE  I-STAT BETA HCG BLOOD, ED ([facility], [facility], [facility] ONLY)    EKG None  Radiology No results found.  Procedures Procedures (including critical care time)  Medications Ordered in ED Medications - No data to display   Initial Impression / Assessment and Plan / ED Course  I have reviewed the triage vital signs and the nursing notes.  Pertinent labs & imaging results that were available during my care of the patient were reviewed by me and considered in my medical decision making (see chart for details).     22 year old female here with suicidal ideation.  Concern for decompensated depressive disorder.  Lab work unremarkable and patient is medically stable for psychiatric evaluation.  She is here voluntarily.  Final Clinical Impressions(s) / ED Diagnoses   Final diagnoses:  Suicidal thoughts    ED Discharge Orders    None       Shaune Pollack, MD 04/12/18 1811
